# Patient Record
Sex: Female | Born: 1980 | Hispanic: No | Marital: Single | Smoking: Former smoker
Health system: Southern US, Community
[De-identification: ages and names within clinical notes are randomized; demographics above are authoritative.]

## PROBLEM LIST (undated history)

## (undated) DIAGNOSIS — K5000 Crohn's disease of small intestine without complications: Principal | ICD-10-CM

## (undated) DIAGNOSIS — K5090 Crohn's disease, unspecified, without complications: Secondary | ICD-10-CM

## (undated) DIAGNOSIS — F32A Depression, unspecified: Secondary | ICD-10-CM

## (undated) DIAGNOSIS — K5190 Ulcerative colitis, unspecified, without complications: Secondary | ICD-10-CM

## (undated) DIAGNOSIS — F429 Obsessive-compulsive disorder, unspecified: Secondary | ICD-10-CM

## (undated) DIAGNOSIS — I10 Essential (primary) hypertension: Secondary | ICD-10-CM

## (undated) DIAGNOSIS — Q639 Congenital malformation of kidney, unspecified: Secondary | ICD-10-CM

## (undated) DIAGNOSIS — F419 Anxiety disorder, unspecified: Secondary | ICD-10-CM

## (undated) HISTORY — PX: COLON SURGERY: SHX602

## (undated) HISTORY — PX: FINGER SURGERY: SHX640

## (undated) HISTORY — PX: HIP SURGERY: SHX245

---
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
## 2003-12-11 ENCOUNTER — Inpatient Hospital Stay (HOSPITAL_COMMUNITY): Admission: AD | Admit: 2003-12-11 | Discharge: 2003-12-11 | Payer: Self-pay | Admitting: Obstetrics and Gynecology

## 2004-04-15 ENCOUNTER — Inpatient Hospital Stay (HOSPITAL_COMMUNITY): Admission: AD | Admit: 2004-04-15 | Discharge: 2004-04-15 | Payer: Self-pay | Admitting: Obstetrics & Gynecology

## 2004-04-22 ENCOUNTER — Ambulatory Visit: Payer: Self-pay | Admitting: Family Medicine

## 2004-07-13 ENCOUNTER — Ambulatory Visit: Payer: Self-pay | Admitting: Obstetrics and Gynecology

## 2004-08-26 ENCOUNTER — Ambulatory Visit: Payer: Self-pay | Admitting: Obstetrics and Gynecology

## 2005-07-07 ENCOUNTER — Inpatient Hospital Stay (HOSPITAL_COMMUNITY): Admission: EM | Admit: 2005-07-07 | Discharge: 2005-07-17 | Payer: Self-pay | Admitting: Emergency Medicine

## 2005-10-20 ENCOUNTER — Emergency Department (HOSPITAL_COMMUNITY): Admission: EM | Admit: 2005-10-20 | Discharge: 2005-10-20 | Payer: Self-pay | Admitting: Emergency Medicine

## 2005-11-29 ENCOUNTER — Inpatient Hospital Stay (HOSPITAL_COMMUNITY): Admission: EM | Admit: 2005-11-29 | Discharge: 2005-12-02 | Payer: Self-pay | Admitting: Emergency Medicine

## 2006-05-01 ENCOUNTER — Emergency Department (HOSPITAL_COMMUNITY): Admission: EM | Admit: 2006-05-01 | Discharge: 2006-05-01 | Payer: Self-pay | Admitting: Emergency Medicine

## 2008-02-18 ENCOUNTER — Inpatient Hospital Stay
Admission: EM | Admit: 2008-02-18 | Discharge: 2008-02-19 | Disposition: A | Discharge status: Other Acute Inpatient Hospital | Source: Emergency Department | Attending: Nurse Practitioner | Admitting: Nurse Practitioner

## 2008-02-18 DIAGNOSIS — K630 Abscess of intestine: Secondary | ICD-10-CM

## 2008-02-18 LAB — CBC WITH AUTOMATED DIFF
ABS. LYMPHOCYTES: 3 10*3/uL (ref 0.6–4.3)
ABS. MONOCYTES: 1 10*3/uL — ABNORMAL HIGH (ref 0.1–0.9)
ABS. NEUTROPHILS: 15.1 10*3/uL — ABNORMAL HIGH (ref 1.9–7.8)
HCT: 39.1 % (ref 35.6–45.0)
HGB: 13.3 g/dL (ref 11.7–15.0)
LYMPHOCYTES: 16 % (ref 14.7–41.3)
MCH: 28.9 PG (ref 26.1–32.9)
MCHC: 34 g/dL (ref 31.4–35.0)
MCV: 84.8 FL (ref 79.6–97.8)
MONOCYTES: 5 % (ref 3.2–9.0)
MPV: 7.9 FL (ref 7.4–10.4)
NEUTROPHILS: 79 % — ABNORMAL HIGH (ref 47.0–74.6)
PLATELET: 411 10*3/uL (ref 140–440)
RBC: 4.61 M/uL (ref 3.86–5.18)
RDW: 12.5 % (ref 11.9–14.6)
WBC: 19.1 10*3/uL — ABNORMAL HIGH (ref 4.5–10.5)

## 2008-02-18 LAB — METABOLIC PANEL, COMPREHENSIVE
A-G Ratio: 0.7 — ABNORMAL LOW (ref 1.2–3.5)
ALT (SGPT): 28 U/L — ABNORMAL LOW (ref 39–65)
AST (SGOT): 8 U/L — ABNORMAL LOW (ref 15–37)
Albumin: 3.1 g/dL — ABNORMAL LOW (ref 3.5–5.0)
Alk. phosphatase: 149 U/L — ABNORMAL HIGH (ref 50–136)
Anion gap: 11 mmol/L (ref 7–16)
BUN: 10 MG/DL (ref 7–18)
Bilirubin, total: 0.3 MG/DL (ref 0.2–1.1)
CO2: 24 MMOL/L (ref 21–32)
Calcium: 8.7 MG/DL (ref 8.4–10.4)
Chloride: 104 MMOL/L (ref 98–107)
Creatinine: 0.8 MG/DL (ref 0.6–1.0)
GFR est AA: >60 mL/min/{1.73_m2} (ref >60)
GFR est non-AA: >60 mL/min/{1.73_m2} (ref >60)
Globulin: 4.4 g/dL — ABNORMAL HIGH (ref 2.3–3.5)
Glucose: 104 MG/DL (ref 74–106)
Potassium: 3.6 MMOL/L (ref 3.5–5.1)
Protein, total: 7.5 g/dL (ref 6.3–8.2)
Sodium: 139 MMOL/L (ref 136–145)

## 2008-02-18 LAB — HCG URINE, QL. - POC: Pregnancy test,urine (POC): NEGATIVE

## 2008-02-18 MED ADMIN — hydrocortisone sodium succinate (SOLUCORTEF) injection 100 mg: INTRAVENOUS | @ 23:00:00 | NDC 00409485605

## 2008-02-18 MED ADMIN — mesalamine EC (ASACOL) tablet 400 mg: ORAL | @ 14:00:00 | NDC 00149075215

## 2008-02-18 MED ADMIN — levofloxacin (LEVAQUIN) 750 mg infusion: INTRAVENOUS | @ 13:00:00 | NDC 00045006601

## 2008-02-18 MED ADMIN — ioversol (OPTIRAY) 350 mg/mL contrast solution 100 mL: INTRAVENOUS | @ 08:00:00 | NDC 00019133321

## 2008-02-18 MED ADMIN — nicotine (NICODERM CQ) 21 mg/24 hr patch 1 Patch: TRANSDERMAL | @ 12:00:00 | NDC 00067512609

## 2008-02-18 MED ADMIN — HYDROmorphone (DILAUDID) injection 0.5 mg: INTRAVENOUS | @ 14:00:00 | NDC 00641012121

## 2008-02-18 MED ADMIN — lorazepam (ATIVAN) tablet 0.5 mg: ORAL | @ 19:00:00 | NDC 00904150061

## 2008-02-18 MED ADMIN — mesalamine EC (ASACOL) 400 mg tablet: @ 10:00:00 | NDC 00149075215

## 2008-02-18 MED ADMIN — dextrose 5% - 0.45% NaCl with KCl 20 mEq/L infusion: INTRAVENOUS | @ 22:00:00 | NDC 00409790209

## 2008-02-18 MED ADMIN — sodium chloride 0.9 % bolus infusion 1,000 mL: INTRAVENOUS | @ 05:00:00 | NDC 00409798309

## 2008-02-18 MED ADMIN — lorazepam (ATIVAN) injection 0.5 mg: INTRAVENOUS | @ 12:00:00 | NDC 10019010239

## 2008-02-18 MED ADMIN — promethazine (PHENERGAN) injection 25 mg: INTRAVENOUS | @ 06:00:00 | NDC 00641092821

## 2008-02-18 MED ADMIN — metronidazole (FLAGYL) IVPB 500 mg: INTRAVENOUS | @ 14:00:00 | NDC 00409781137

## 2008-02-18 MED ADMIN — mesalamine EC (ASACOL) tablet 800 mg: ORAL | @ 22:00:00 | NDC 00149075215

## 2008-02-18 MED ADMIN — HYDROmorphone (DILAUDID) injection 0.5 mg: INTRAVENOUS | @ 18:00:00 | NDC 00641012121

## 2008-02-18 MED ADMIN — promethazine (PHENERGAN) injection 12.5 mg: INTRAVENOUS | @ 14:00:00 | NDC 00641092821

## 2008-02-18 MED ADMIN — diatrizoate meglumine & sodium (MD-GASTROVIEW) 66-10 % contrast solution 30 mL: ORAL | @ 08:00:00 | NDC 00019481605

## 2008-02-18 MED ADMIN — mesalamine EC (ASACOL) tablet 400 mg: ORAL | @ 21:00:00 | NDC 68258912901

## 2008-02-18 MED ADMIN — HYDROmorphone (DILAUDID) injection 0.5 mg: INTRAVENOUS | @ 23:00:00 | NDC 00641012121

## 2008-02-18 MED ADMIN — HYDROmorphone (DILAUDID) injection 1 mg: INTRAVENOUS | @ 08:00:00 | NDC 00641012121

## 2008-02-18 MED ADMIN — metronidazole (FLAGYL) IVPB 500 mg: INTRAVENOUS | @ 09:00:00 | NDC 00409781137

## 2008-02-18 MED ADMIN — neomycin-bacitracin-polymyxin (NEOSPORIN) ointment: TOPICAL | @ 19:00:00 | NDC 45802006103

## 2008-02-18 MED ADMIN — neomycin-bacitracin-polymyxin (NEOSPORIN) ointment: TOPICAL | @ 23:00:00 | NDC 96295006880

## 2008-02-18 MED ADMIN — metronidazole (FLAGYL) IVPB 500 mg: INTRAVENOUS | @ 22:00:00 | NDC 00409781137

## 2008-02-18 MED ADMIN — dextrose 5% - 0.45% NaCl with KCl 20 mEq/L infusion: INTRAVENOUS | @ 12:00:00 | NDC 00409790209

## 2008-02-18 MED ADMIN — morphine injection 5 mg: INTRAVENOUS | @ 06:00:00 | NDC 10019017839

## 2008-02-18 MED ADMIN — metronidazole (FLAGYL) IVPB 500 mg: INTRAVENOUS | @ 12:00:00 | NDC 80830246509

## 2008-02-18 MED ADMIN — ketorolac (TORADOL) injection 30 mg: INTRAVENOUS | @ 06:00:00 | NDC 00409379501

## 2008-02-18 NOTE — Progress Notes (Signed)
Pt off of floor to go smoke

## 2008-02-18 NOTE — ED Notes (Signed)
In/out cath for 30 ml clear yellow urine. Was very painful per pt. States abd pain worse with movement.

## 2008-02-18 NOTE — Progress Notes (Signed)
New iv site started to right inner forearm on first attempt 20 jelco. Pt tolerated well

## 2008-02-18 NOTE — Progress Notes (Signed)
Pt resting quietly without complaints of pain, respirations are even no distress observed.

## 2008-02-18 NOTE — Progress Notes (Signed)
Patient complains of pain rated 6/10 on scale. Dilaudid 0.5 mg slow IV given for complaint of pain. Patient complains of nausea, no emesis or vomiting noted. Phenergan 12.5 mg slow IV given for complaint of nausea.

## 2008-02-18 NOTE — ED Notes (Signed)
finished oral contrast

## 2008-02-18 NOTE — Progress Notes (Signed)
TRANSFER - IN REPORT:    Verbal report received from Caren Libertoon [employer]  being received from [facility] [employer] routine progression of care      Report consisted of patient???s Situation, Background, Assessment and   Recommendations(SBAR).     Information from the following report(s) ED Summary was reviewed with the receiving nurse.    Opportunity for questions and clarification was provided.      Assessment completed upon patient???s arrival to unit and care assumed.

## 2008-02-18 NOTE — ED Notes (Signed)
2nd bag NS up at 200cc/hr

## 2008-02-18 NOTE — ED Notes (Signed)
To CT

## 2008-02-18 NOTE — ED Notes (Signed)
Pt reported to Dr. Sheffield Slider as a very probable appendicitis and is just waiting on contrasted CT to confirm.

## 2008-02-18 NOTE — Progress Notes (Signed)
Pt rates pain 6/10. Some relief noted from Dilaudid. Pt given broth and jello per request and notified of NPO status after midnight. Verbalizes understanding.

## 2008-02-18 NOTE — ED Notes (Signed)
Dr. Selena Batten here to see

## 2008-02-18 NOTE — Progress Notes (Signed)
TRANSFER - OUT REPORT:    Verbal report given to Jasmine December, RN on [employer]  being transferred to Room 346 for routine progression of care       Report consisted of patient???s Situation, Background, Assessment and   Recommendations(SBAR).     Information from the following report(s) ED Summary was reveiwed with the receiving nurse.    Opportunity for questions and clarification was provided.

## 2008-02-18 NOTE — Progress Notes (Signed)
Bedside report given to [facility] questions asked and answered

## 2008-02-18 NOTE — Progress Notes (Signed)
Problem: Nutrition Deficit  Goal: *Optimize nutritional status  Nutrition screen based on risk factors identified on admission data base:  poor po, n/v/d and allergy to seafood.  Problem:  Altered GI function r/t Crohn's as evidenced by n/v/d, abdominal pain; currently NPO status.  Weight loss of 10-15# past couple of months r/t early satiety, nausea and diarrhea. Currently at 100% IBW with BMI 21.3.  Decreased albumin suggestive of mild visceral protein depletion.  Goal:  Tolerance of po intake to maintain/ promote weight gain and replete protein stores.  Estimated needs:  1750-2025 cal/day (30-35 kcal/kg/abw), 58-70 gm pro/day (1-1.2 gm pro/kg/abw), fluids of 1 ml/kcal or per MD recommendation.  Intervention:  Continue NPO status.  Noted allergy to seafood in Foods and Nutrition.    Initiate Ensure Plus supplement once diet order permits.  Follow up for diet advancement/tolerance.

## 2008-02-18 NOTE — Progress Notes (Signed)
Pt resting quietly SF, respirations even and unlabored. C/O pain at IV site. Warm pack applied, good blood return noted. Pt requesting new IV. Norvel Richards notified d/t primary nurse still receiving report. No distress noted. Will continue to monitor.

## 2008-02-18 NOTE — ED Notes (Signed)
Patient refuses steriods because when dx with crohns in [place] she became mean and gained 100+ lbs.

## 2008-02-18 NOTE — ED Provider Notes (Signed)
Patient is a 28 y.o. female presenting with abdominal pain. The history is provided by the patient.   Abdominal Pain   The current episode started more than 2 days ago. The problem occurs constantly. The problem has been rapidly worsening. Associated symptoms include back pain. Pertinent negatives include no fever. The patient's surgical history includes cholecystectomy.       Past Medical History   Diagnosis Date   ??? Asthma    ??? Gastrointestinal Disorder      chrones   ??? Other Ill-Defined Conditions      herpes   ??? Chronic Kidney Disease      no right kidney          Past Surgical History   Procedure Date   ??? Abdomen surgery proc unlisted      uterus  blockage removed   ??? Hx orthopaedic      r pinkey   ??? Hx cholecystectomy    ??? Hx other surgical      neg lap this summer           No family history on file.     History   Social History   ??? Marital Status: Married     Spouse Name: N/A     Number of Children: N/A   ??? Years of Education: N/A   Occupational History   ??? Not on file.   Social History Main Topics   ??? Tobacco Use: Yes -- 1.0 packs/day   ??? Alcohol Use: No   ??? Drug Use: No   ??? Sexually Active: Yes   Other Topics Concern   ??? Not on file   Social History Narrative   ??? No narrative on file           ALLERGIES: Penicillin g      Review of Systems   Constitutional: Negative for fever.   Gastrointestinal: Positive for abdominal pain.   Genitourinary: Positive for vaginal bleeding and pelvic pain.   Musculoskeletal: Positive for back pain.   All other systems reviewed and are negative.        Filed Vitals:    02/17/2008 10:52 PM   BP: 95/67   Pulse: 110   Temp: 99.7 ??F (37.6 ??C)   Resp: 20   Height: 5\' 5"  (1.651 m)   Weight: 128 lb (58.06 kg)              Physical Exam   Constitutional: She is oriented. She appears well-developed and well-nourished. No distress.   HENT:   Head: Normocephalic and atraumatic.   Eyes: Conjunctivae are normal. Pupils are equal, round, and reactive to light.    Cardiovascular: Normal rate and regular rhythm.    Pulmonary/Chest: Effort normal. No respiratory distress.   Abdominal: Soft. She exhibits no distension and no mass. Tenderness (acute) is present in the right lower quadrant. She has guarding and pain at McBurney's point. She has no rebound and no CVA tenderness.        Positive psoas and rovsings signs     Musculoskeletal: Normal range of motion. She exhibits no edema and no tenderness.   Neurological: She is alert and oriented.   Skin: Skin is warm and dry. No rash noted. She is not diaphoretic.   Psychiatric: She has a normal mood and affect. Her behavior is normal. Judgment and thought content normal.            Coding      Procedures

## 2008-02-18 NOTE — Progress Notes (Signed)
MD Dareen Piano consulted for possible appendicitis and abscess on ileum.

## 2008-02-18 NOTE — ED Notes (Signed)
Dr. Sheffield Slider in to talk with pt

## 2008-02-18 NOTE — Progress Notes (Signed)
Pt brought back to floor by security in wheelchair, pt dizzy from smoking. Pt pale

## 2008-02-18 NOTE — Progress Notes (Signed)
Dilaudid 0.5 mg slow IV given for complaint of pain rated 7/10 on scale after NP Teena Dunk completed rectal exam.

## 2008-02-18 NOTE — Progress Notes (Signed)
Provided a caring presence and support during interdisciplinary rounds.    Signed- Zelma Snead Bailey-Loui Massenburg, M.Div. , Staff Chaplain

## 2008-02-18 NOTE — ED Notes (Signed)
Assisted to BR. States pain sstill 7/10. MD notified.

## 2008-02-18 NOTE — Progress Notes (Signed)
Pt resting quietly without complaints of pain, respirations are even no distress observed. Report given to  RN no distress noted

## 2008-02-18 NOTE — ED Notes (Signed)
Back from CT.

## 2008-02-18 NOTE — Progress Notes (Signed)
Problem: Interdisciplinary Rounds  Goal: Interdisciplinary Rounds  Interdisciplinary team rounds were held 02/18/2008 with the following team members:Care Management, Nursing, Nutrition and Pastoral Care and the patient.  Plan of Care options were discussed with the team and the patient.  The patient would benefit from a screening and/or visit from Nursing.

## 2008-02-18 NOTE — ED Notes (Signed)
Starting oral contrast

## 2008-02-18 NOTE — Progress Notes (Signed)
[facility] called to inform of MD Rutherford Limerick as being the on-call doctor.

## 2008-02-18 NOTE — Progress Notes (Signed)
Pt arrived to floor via stretcher, has to go to bathroom, upset that she has to answer questions that was asked in ER

## 2008-02-18 NOTE — H&P (Addendum)
GEN GENERIC H&P/CONSULT    Subjective:     Patient is a 28 y.o. Caucasian female presents with acute right lower quadrant abdominal pain radiating to back and throughout abdomen.  States that pain began two days ago and has progressively worsened.  Denies associated fever or chills.  Has had intermittent nausea, but no emesis, and appetite has been limited.  Reports menstrual period which began on Monday.  No other vaginal bleeding or drainage.  Past history includes inflammatory bowel disease, felt to be Crohn's for which she is on no meds.  Notes no change in bowel habits, but has baseline diarrhea typically about 6-7 stools per day.  Denies blood or mucous in stools.    In ER, WBC's elevated to 19,100 with 79% neutrophils and 1% monos.  Other hematology and chemistries WNL, except elevated Alkaline phosphatase of 149.  CT scan of abdomen/pelvis revealed marked thickening of the wall of the terminal ileum with abscess, and secondary inflammation of the appendix. Ms. Trettin will be admitted for management of her abscess.  IV Flagyl has been initiated in the ER, and Levaquin will be added.    PCP: Dr. Briscoe Deutscher    Past Medical History   Diagnosis Date   ??? Asthma    ??? Chronic Kidney Disease      no right kidney   ??? Other Ill-Defined Conditions      herpes   ??? Gastrointestinal Disorder      crohn's        Past Surgical History   Procedure Date   ??? Abdomen surgery proc unlisted      uterus  blockage removed   ??? Hx orthopaedic      r pinkey   ??? Hx cholecystectomy    ??? Hx other surgical      neg lap this summer        Prior to Admission medications    Medication Sig Start Date End Date Taking? Authorizing Provider   lorazepam (ATIVAN) 0.5 mg tablet Take 0.5 mg by mouth.   Yes Phys Other, MD       Allergies   Allergen Reactions   ??? Penicillin G Hives        History   Substance Use Topics   ??? Tobacco Use: Yes -- 1.0 packs/day   ??? Alcohol Use: No        Family History   Problem Relation   ??? Diabetes Mother             Review of Systems    A comprehensive review of systems was negative except for that written in the HPI.    Objective:               BP 116/82   Pulse 84   Temp 98.7 ??F (37.1 ??C)   Resp 20   Ht 5\' 5"  (1.651 m)   Wt 128 lb (58.06 kg)   LMP 02/17/2008  General:  Alert, tearful, cooperative, appears stated age.   Head:  Normocephalic, without obvious abnormality, atraumatic.   Eyes:  Conjunctivae/corneas clear. PERRL, EOMs intact.    Throat: Lips, mucosa, and tongue normal. Teeth and gums normal.   Neck: Supple, symmetrical, trachea midline, no adenopathy, thyroid: no enlargment/tenderness/nodules, no carotid bruit and no JVD.   Back:   Symmetric, no curvature. ROM normal.    Lungs:   Clear to auscultation bilaterally.   Heart:  Regular rate and rhythm, S1, S2 normal, no murmur, click, rub or gallop.  Abdomen:   Soft, nondistended.  Tenderness to palpation with guarding in RLQ with generalized tenderness throughout abdomen.  Bowel sounds normal. No masses,  No organomegaly.   Extremities: Extremities normal, atraumatic, no cyanosis or edema.   Pulses: 2+ and symmetric all extremities.   Skin: Skin color, texture, turgor normal. No rashes or lesions.   Neurologic: CNII-XII intact. Normal strength and sensation throughout.         Data Review:     Recent Results (from the past 24 hour(s))   CBC W/ AUTOMATED DIFF    Collection Time 02/17/08 11:58 PM   Component Value Range   ??? WBC 19.1 (*) 4.5 - 10.5 (K/uL)   ??? RBC 4.61  3.86 - 5.18 (M/uL)   ??? HGB 13.3  11.7 - 15.0 (g/dL)   ??? HCT 39.1  35.6 - 45.0 (%)   ??? MCV 84.8  79.6 - 97.8 (FL)   ??? MCH 28.9  26.1 - 32.9 (PG)   ??? MCHC 34.0  31.4 - 35.0 (g/dL)   ??? RDW 12.5  11.9 - 14.6 (%)   ??? PLATELET 411  140 - 440 (K/uL)   ??? MPV 7.9  7.4 - 10.4 (FL)   ??? NEUTROPHILS 79 (*) 47.0 - 74.6 (%)   ??? LYMPHOCYTES 16  14.7 - 41.3 (%)   ??? MONOCYTES 5  3.2 - 9.0 (%)   ??? ABSOLUTE NEUTS 15.1 (*) 1.9 - 7.8 (K/UL)   ??? ABSOLUTE LYMPHS 3.0  0.6 - 4.3 (K/UL)   ??? ABSOLUTE MONOS 1.0 (*) 0.1 - 0.9 (K/UL)    ??? DF AUTOMATED     METABOLIC PANEL, COMPREHENSIVE    Collection Time 02/17/08 11:58 PM   Component Value Range   ??? Sodium 139  136 - 145 (MMOL/L)   ??? Potassium 3.6  3.5 - 5.1 (MMOL/L)   ??? Chloride 104  98 - 107 (MMOL/L)   ??? CO2 24  21 - 32 (MMOL/L)   ??? Anion gap 11  7 - 16 (mmol/L)   ??? Glucose 104  74 - 106 (MG/DL)   ??? BUN 10  7 - 18 (MG/DL)   ??? Creatinine 0.8  0.6 - 1.0 (MG/DL)   ??? GFR est AA >60  >60 (ml/min/1.10m2)   ??? GFR est non-AA >60  >60 (ml/min/1.47m2)   ??? Calcium 8.7  8.4 - 10.4 (MG/DL)   ??? Bilirubin, total 0.3  0.2 - 1.1 (MG/DL)   ??? ALT 28 (*) 39 - 65 (U/L)   ??? AST 8 (*) 15 - 37 (U/L)   ??? Alk. phosphatase 149 (*) 50 - 136 (U/L)   ??? Protein, total 7.5  6.3 - 8.2 (g/dL)   ??? Albumin 3.1 (*) 3.5 - 5.0 (g/dL)   ??? Globulin 4.4 (*) 2.3 - 3.5 (g/dL)   ??? A-G Ratio 0.7 (*) 1.2 - 3.5 ( )   HCG, UR, QL - POC    Collection Time 02/18/08 12:35 AM   Component Value Range   ??? Pregnancy test,urine (POC) negative   > Negative    ??? Pregnancy test,QC                 Assessment:     Patient Active Hospital Problem List:  *Abscess of Intestine (02/18/2008) - continue Flagyl and add Levaquin.  [facility]    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    Anxiety - use Ativan prn.      Plan:     1.  Admit Inpatient Medical.  2. Continue Flagyl.  3.  Add Levaquin.  4. Maintain NPO with meds, pending GI consult.  5. IV hydration.    6. DVT prophylaxis with SCD's and ambulation for now pending decisions re:  GI work-up.  7. Medicate prn pain/anxiety.  8. Nicoderm patch - reviewed need to quit smoking - not interested at this time.  9.  Check blood cultures.    Elita Boone, NP    Pt seen and examined.  Agree with above.  Pt with no active nausea or vomiting or diarrhea.  She is afebrile.  Waiting for GI to see.    Towanda Malkin, MD

## 2008-02-18 NOTE — ED Notes (Signed)
I have reviewed the chart and agree with the midlevel provider's course of treatment.  Avin Gibbons Louis Tausha Milhoan, MD

## 2008-02-18 NOTE — Progress Notes (Signed)
Patient voiced concerns and disapproval with surgery consult Dr.Farooq and requested to speak with patient relations. I informed my charge nurse R. Freida Busman RN who contacted patient relations.

## 2008-02-18 NOTE — Progress Notes (Signed)
Patient upset after visit from MD Rutherford Limerick, patient crying. Discussed with patient about MD Farooq's visit. Patient states, " Doctor Rutherford Limerick was rude and he really scared me with what he was saying, he even answered his phone and walked out when I was talking to him." patient relations called around 1700 after talking with patient. Patient relations to visit with patient. Patient expresses wished to see a different surgeon, verbalized to patient her healthcare rights, patient verbalizes understanding.

## 2008-02-18 NOTE — Consults (Addendum)
[facility] Consult Note    Referring Physician:  Dr Laveda Norman    Consult Date:  02/18/2008    Admit Date:  02/17/2008    Chief Complaint:  Terminal illeitis and abscess.    Subjective:     History of Present Illness:  Patient is a 28 y.o. female who is seen in consultation at the request of Dr. Laveda Norman for illeum abscess, chron's hisory. She c/o of a constant RLQ ABD pain that has increased in intensity to 10/10 and radiates to her back. She has a history of Cholecystectomy. The pain is accompanied by nausea, without vomiting. She complains hard to breath when pain intensifies. She states that she has never had a Colonoscopy and was diagnosed by x-ray with Chron's. She says that she was set up to have a Colonoscopy in [place], but with anesthesia, she was combative and it was not completed. She has been on Prednisone in 2007 for several months and says that she gained at least 20 pounds and it caused joint aches and she refuses to be on them again.  She c/o heartburn. Denies CP.She denies hematochezia, melena. Her last BM was yesterday and she is currently on her menstrual cycle. She normally has 6 loose stools per day with tenesmus.  She states that she is always cold, denies fever. She lost 10 pounds within the last month.    PMH:  Past Medical History   Diagnosis Date   ??? Asthma    ??? Chronic Kidney Disease      no right kidney   ??? Other Ill-Defined Conditions      herpes   ??? Gastrointestinal Disorder      crohn's         PSH:  Past Surgical History   Procedure Date   ??? Abdomen surgery proc unlisted      uterus  blockage removed   ??? Hx orthopaedic      r pinkey   ??? Hx cholecystectomy    ??? Hx other surgical      neg lap this summer         Allergies:  Allergies   Allergen Reactions   ??? Penicillin G Hives         Home Medications:  Prior to Admission medications    Medication Sig Start Date End Date Taking? Authorizing Provider   lorazepam (ATIVAN) 0.5 mg tablet Take 0.5 mg by mouth.   Yes Phys Other, MD          Hospital Medications:  Current facility-administered medications   Medication Dose Route Frequency   ??? diatrizoate meglumine & sodium (MD-GASTROVIEW) 66-10 % contrast solution 30 mL  30 mL Oral RAD ONCE   ??? ketorolac (TORADOL) injection 30 mg  30 mg IntraVENous NOW   ??? morphine injection 5 mg  5 mg IntraVENous NOW   ??? promethazine (PHENERGAN) injection 25 mg  25 mg IntraVENous NOW   ??? HYDROmorphone (DILAUDID) injection 1 mg  1 mg IntraVENous NOW   ??? ioversol (OPTIRAY) 350 mg/mL contrast solution 100 mL  100 mL IntraVENous RAD ONCE   ??? dexamethasone (DECADRON) injection 10 mg  10 mg IntraVENous NOW   ??? metronidazole (FLAGYL) IVPB 500 mg  500 mg IntraVENous NOW   ??? mesalamine EC (ASACOL) tablet 400 mg  400 mg Oral TID   ??? mesalamine EC (ASACOL) 400 mg tablet        ??? lorazepam (ATIVAN) tablet 0.5 mg  0.5 mg Oral Q6H PRN   ???  hydrocodone-acetaminophen (LORTAB) 5-500 mg per tablet 1 Tab  1 Tab Oral Q4H PRN   ??? HYDROmorphone (DILAUDID) injection 0.5 mg  0.5 mg IntraVENous Q4H PRN   ??? promethazine (PHENERGAN) injection 12.5 mg  12.5 mg IntraVENous Q6H PRN   ??? lorazepam (ATIVAN) injection 0.5 mg  0.5 mg IntraVENous Q6H PRN   ??? zolpidem (AMBIEN) tablet 5 mg  5 mg Oral QHS PRN   ??? nicotine (NICODERM CQ) 21 mg/24 hr patch 1 Patch  1 Patch TransDERmal Q24H   ??? levofloxacin (LEVAQUIN) 750 mg infusion   750 mg IntraVENous Q24H   ??? dextrose 5% - 0.45% NaCl with KCl 20 mEq/L infusion    IntraVENous CONTINUOUS   ??? ondansetron (ZOFRAN) injection 4 mg  4 mg IntraVENous Q4H PRN   ??? metronidazole (FLAGYL) IVPB 500 mg  500 mg IntraVENous Q6H   ??? DISCONTD: dextrose 5 % - 0.45% NaCl 1,000 mL with potassium chloride 20 mEq infusion    IntraVENous CONTINUOUS   ??? DISCONTD: ondansetron (ZOFRAN) injection 4 mg  4 mg IntraVENous Q4H PRN   ??? DISCONTD: metronidazole (FLAGYL) IVPB 500 mg  500 mg IntraVENous Q6H   ??? sodium chloride 0.9 % bolus infusion 1,000 mL  1,000 mL IntraVENous CONTINUOUS         Social History:  History    Substance Use Topics   ??? Tobacco Use: Yes -- 1.0 packs/day   ??? Alcohol Use: No       Pt has 3 tattoos that were placed on bilateral hips, ankle in 2001, 2003, and 2005. Pt denies any history of IV drug use, blood transfusions, prophylactic antibiotics prior to dental work, cardiac stents, pacemaker placement, or vaccinations for Hep A or B.    Family History:  Family History   Problem Relation   ??? Diabetes Mother     She denies a family history of colon cancer, colon polyps, gallbladder disease, ulcers.    Review of Systems:  A detailed 10 system ROS is obtained, with pertinent positives as listed above.  All others are negative.    Objective:     Physical Exam:  Vitals:  BP 87/62   Pulse 81   Temp 97 ??F (36.1 ??C)   Resp 19   Ht 5\' 5"  (1.651 m)   Wt 128 lb (58.06 kg)   SpO2 98%   LMP 02/17/2008  Gen:  Pt is alert, cooperative, no acute distress  Skin:  Extremities and face reveal no rashes. No palmer erythema. No telangiectasias on the chest wall. No juandice noted.   HEENT: Sclerae anicteric.  Extra-occular muscles are intact.  No oral ulcers.  No abnormal pigmentation of the lips.  The neck is supple.  Cardiovascular: Regular rate and rhythm. No murmurs, gallops, or rubs.  Respiratory:  Comfortable breathing with no accessory muscle use. Clear breath sounds anteriorly with no wheezes, rales, or rhonchi.  GI:  Abdomen nondistended, soft, and RLQ tenderness on palpation.  Normal active bowel sounds. No enlargement of the liver or spleen. No masses palpable. No rebound tenderness noted.   Rectal:  Rectal Exam initiated extreme pain did not note any fissures, palpable masses or internal hemorhoids at the level of the exam.  Musculoskeletal:  No pitting edema of the lower legs.  Extremities have good range of motion.  No costovertebral tenderness.  Neurological:  Gross memory appears intact.  Patient is alert and oriented.  Psychiatric:  Mood appears appropriate with judgement intact.   Lymphatic:  No cervical or  supraclavicular adenopathy.    Laboratory:    Recent Labs   Basename 02/17/08 2358   ??? WBC 19.1*   ??? HGB 13.3   ??? HCT 39.1   ??? PLT 411   ??? MCV 84.8   ??? NA 139   ??? K 3.6   ??? CL 104   ??? CO2 24   ??? BUN 10   ??? CREA 0.8   ??? CA 8.7   ??? GLU 104   ??? AP 149*   ??? SGOT 8*   ??? GPT 28*   ??? TBIL 0.3   ??? CBIL --   ??? AML --   ??? LPSE --   ??? PTP --   ??? INR --   ??? 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  -Terminal illeum inflammed probably due to Chron's.  -Kidney function within normal limits will continue Asacol. Continue to monitor kidney function while on Asacol.  -Slightly elevated Alk Phos,may be due to fatty liver. CT shows fatty infiltration of liver, will order a Pt/INR/ Lipid Panel for further liver evaluation.   - Although pt is afebrile, Do feel that this is acute appendicitis due to S/Sx: RLQ ABD Pain, increased WBC's, Rectal exam producing extreme pain.   -Continue to monitor WBC. CBC tomorrow a.m.  - Surgery consult Dr Dareen Piano for appendix/illeum abscess for possible drainage and appendix surgery evaluation.  -If Ok with surgery pt may have clear liquid diet today. Will evaluate for advancement of diet of full liquid  Tomorrow, then for a goal of soft, low fat diet.  -Will add Daily Metamucil fiber supplement once pt starts eating po.  -C/o heartburn. Start daily Protonix po today.  -Pt has never had a Colonoscopy. Will hold off on Colonoscopy right now due to the acute exacerbation of inflammatory process. Pt will need a Colonoscopy at some point to further evaluate colon.   -Pt c/o always being cold, will recommend evaluation of thyroid function.  -History of Chron's. CT ABD/Pelvis show   IMPRESSION: ??  1. INFLAMMATORY CHANGES AT THE TERMINAL ILEUM WITH SMALL CONTAINED   PERFORATION AND SMALL ADJACENT RIGHT LOWER QUADRANT ABSCESS. ??FINDINGS   CONSISTENT WITH A TERMINAL ILEITIS AND PROBABLY INFLAMMATORY BOWEL DISEASE.  2. UTERINE DIDELPHIS, CHOLECYSTECTOMY. ??SINGLE RIGHT KIDNEY.          Patient is seen and examined in collaboration with Dr. Jordan Likes.  Assessment and plan as per Dr. Jordan Likes.

## 2008-02-18 NOTE — Progress Notes (Signed)
Pt still rates pain 7/10. No relief noted from lortab and ativan. 0.5mg  Dilaudid given per pt request for pain. Will continue to monitor. Respirations even and unlabored.

## 2008-02-18 NOTE — Consults (Signed)
[facility]   [address]   [place], [place]. [address]   [phone]     CONSULTATION    NAME: Christine, Castillo MR: [id number]  LOC: [id number] [id number] SEX: F ACCT: [id number]  DOB: 09/24/1980 AGE: 28 PT: I  ADMIT: 02/18/2008 DSCH: MSV: MED    DATE OF CONSULTATION: 02/18/2008    REASON FOR CONSULTATION: Abdominal pain, possible terminal ileitis  abscess    BRIEF HISTORY: Christine Castillo is a 28 year old patient with possible  history of Crohn's, history of chronic diarrhea with rectal bleeding,  right lower quadrant abdominal pain and CT evidence of ileitis. At this  time, she presents to the hospital with right lower quadrant abdominal  pain. She was admitted on 02/17/2008. The patient had a CT scan of the  abdomen and pelvis done which showed evidence of terminal ileitis and  questionable abscess with localized perforation and fluid collection  suggestive of that. The patient has been having abdominal pain with  diarrhea. She is having about 8-12 bowel movements. She is quite  miserable. No history of melena. She has some tenesmus. No history  of fever. She has lost about 10 pounds in the last month.    PAST MEDICAL HISTORY:  1. Asthma  2. Chronic kidney disease  3. History of herpes  4. History of Crohn's  5. History of obesity in the past  6. History of depression    PAST SURGICAL HISTORY:  1. The patient has had uterine surgery in the past.  2. Cholecystectomy.  3. Negative laparotomy in the past.    ALLERGIES: PENICILLIN    HOME MEDICATIONS: Lorazepam    HOSPITAL MEDICATIONS: At this time, her hospital medications are  documented and reviewed.    FAMILY HISTORY: No history of cancer.    SOCIAL HISTORY: She smokes a pack of cigarettes. She has twins. No  history of drug abuse.    FAMILY HISTORY: Diabetes. No history of colon cancer.    REVIEW OF SYSTEMS: Negative for 10 systems, other than mentioned  above.    PHYSICAL EXAM:  VITAL SIGNS: Normal, stable, documented in the chart.  HEENT: Normal.   NECK: Soft, non-tender.  LUNGS: Clear.  CARDIAC: S1, S2 audible. No murmur.  ABDOMEN: Soft. Right lower quadrant tenderness with minimal rebound.  No guarding, no signs of rigidity.  RECTAL EXAM; Deferred. It was done by the nurse practitioner and the  patient has no obvious fissures, but did have extreme pain.  MUSCULOSKELETAL, SKIN, NEUROLOGIC EXAMS: Unremarkable.  PSYCHIATRIC: The patient appears to be depressed.    LABORATORY DATA: White count 19,000, hemoglobin 13, hematocrit 39.  CBC is unremarkable. Liver function tests show slight increase in  alkaline phosphatase. CT scan of the abdomen and pelvis has been done.  I have reviewed the results. This showed evidence of terminal ileitis  with localized perforation and small fluid collection suggestive of  possible abscess.    IMPRESSION:  1. Possible Crohn's disease with terminal ileitis localized  perforation.    PLAN: At this time, this 28 year old patient is stable with no signs of  toxicity. My plan at this time is to continue treating her with NPO, IV  fluid hydration, and IV antibiotics. I am going to add IV steroids and  increase her Mesalamine. We will keep a close eye on her and see how she  does. If her symptoms do not improve over the next 24-48 hours, then  patient may be a candidate for exploratory laparotomy. However, if  the  symptoms improve, then the patient may be able to avoid an operation. I  have discussed all of this with the patient. She is not very happy with  her care, but under the circumstances, I have tried to explain to her  that keeping her NPO and starting steroids is the right thing to do. I  will continue to follow her.              Marjory Lies, MD     This is an unverified document unless signed by physician.    TID: [id number] DT: 02/18/2008 4:28 P  JOB: [id number] DOC#: [id number] DD: 02/18/2008    cc: Marjory Lies, MD

## 2008-02-18 NOTE — Progress Notes (Signed)
Pt resting quietly without complaints of pain, respirations are even no distress observed. Report received from offgoing shift RN and care assumed at this time

## 2008-02-18 NOTE — Progress Notes (Signed)
Please see dictated note

## 2008-02-18 NOTE — Progress Notes (Signed)
Lortab and ativan given per pt request for pain 7/10 and anxiety. Husband at [facility]. No other complaints or distress noted. Will continue to monitor.

## 2008-02-18 NOTE — Progress Notes (Signed)
Pt resting quietly without complaints of pain, respirations are even no distress observed. No change in assessment no needs voiced.

## 2008-02-19 ENCOUNTER — Inpatient Hospital Stay
Admit: 2008-02-19 | Discharge: 2008-02-22 | Disposition: A | Discharge status: Home / Self Care | Source: Home / Self Care | Attending: Internal Medicine | Admitting: Internal Medicine

## 2008-02-19 LAB — CBC WITH AUTOMATED DIFF
ABS. BASOPHILS: 0.1 10*3/uL (ref 0.0–0.2)
ABS. EOSINOPHILS: 0 10*3/uL (ref 0.00–0.80)
ABS. LYMPHOCYTES: 1.3 10*3/uL (ref 0.6–4.3)
ABS. MONOCYTES: 0.3 10*3/uL (ref 0.1–0.9)
ABS. NEUTROPHILS: 12.8 10*3/uL — ABNORMAL HIGH (ref 1.9–7.8)
BASOPHILS: 0 % — ABNORMAL LOW (ref 0.1–1.6)
EOSINOPHILS: 0 % — ABNORMAL LOW (ref 0.5–7.8)
HCT: 33.2 % — ABNORMAL LOW (ref 35.6–45.0)
HGB: 11.6 g/dL — ABNORMAL LOW (ref 11.7–15.0)
LYMPHOCYTES: 9 % — ABNORMAL LOW (ref 14.7–41.3)
MCH: 29.5 PG (ref 26.1–32.9)
MCHC: 34.8 g/dL (ref 31.4–35.0)
MCV: 84.7 FL (ref 79.6–97.8)
MONOCYTES: 2 % — ABNORMAL LOW (ref 3.2–9.0)
MPV: 8 FL (ref 7.4–10.4)
NEUTROPHILS: 89 % — ABNORMAL HIGH (ref 47.0–74.6)
PLATELET: 348 10*3/uL (ref 140–440)
RBC: 3.93 M/uL (ref 3.86–5.18)
RDW: 13.2 % (ref 11.9–14.6)
WBC: 14.4 10*3/uL — ABNORMAL HIGH (ref 4.5–10.5)

## 2008-02-19 LAB — METABOLIC PANEL, BASIC
Anion gap: 11 mmol/L (ref 7–16)
BUN: 3 MG/DL — ABNORMAL LOW (ref 7–18)
CO2: 21 MMOL/L (ref 21–32)
Calcium: 8.2 MG/DL — ABNORMAL LOW (ref 8.4–10.4)
Chloride: 108 MMOL/L — ABNORMAL HIGH (ref 98–107)
Creatinine: 0.6 MG/DL (ref 0.6–1.0)
GFR est AA: >60 mL/min/{1.73_m2} (ref >60)
GFR est non-AA: >60 mL/min/{1.73_m2} (ref >60)
Glucose: 140 MG/DL — ABNORMAL HIGH (ref 74–106)
Potassium: 4.4 MMOL/L (ref 3.5–5.1)
Sodium: 140 MMOL/L (ref 136–145)

## 2008-02-19 LAB — MAGNESIUM: Magnesium: 1.8 MG/DL (ref 1.8–2.4)

## 2008-02-19 MED ADMIN — ondansetron (ZOFRAN) injection 4 mg: INTRAVENOUS | NDC 00143989105

## 2008-02-19 MED ADMIN — neomycin-bacitracin-polymyxin (NEOSPORIN) ointment: TOPICAL | @ 14:00:00 | NDC 00713026831

## 2008-02-19 MED ADMIN — dextrose 5% - 0.45% NaCl with KCl 20 mEq/L infusion: INTRAVENOUS | @ 22:00:00 | NDC 00409790209

## 2008-02-19 MED ADMIN — nicotine (NICODERM CQ) 21 mg/24 hr patch 1 Patch: TRANSDERMAL | @ 13:00:00 | NDC 00067512609

## 2008-02-19 MED ADMIN — zolpidem (AMBIEN) tablet 5 mg: ORAL | @ 05:00:00 | NDC 68084022511

## 2008-02-19 MED ADMIN — hydrocortisone sodium succinate (SOLUCORTEF) injection 100 mg: INTRAVENOUS | @ 23:00:00 | NDC 00009090020

## 2008-02-19 MED ADMIN — hydrocortisone sodium succinate (SOLUCORTEF) injection 100 mg: INTRAVENOUS | @ 07:00:00 | NDC 00009082501

## 2008-02-19 MED ADMIN — hydrocodone-acetaminophen (LORTAB) 5-500 mg per tablet 1 Tab: ORAL | @ 02:00:00 | NDC 00406035762

## 2008-02-19 MED ADMIN — dextrose 5% - 0.45% NaCl with KCl 20 mEq/L infusion: INTRAVENOUS | @ 13:00:00 | NDC 00409790209

## 2008-02-19 MED ADMIN — promethazine (PHENERGAN) injection 12.5 mg: INTRAVENOUS | @ 13:00:00 | NDC 00641092821

## 2008-02-19 MED ADMIN — lorazepam (ATIVAN) tablet 0.5 mg: ORAL | @ 16:00:00 | NDC 00904150061

## 2008-02-19 MED ADMIN — HYDROmorphone (DILAUDID) injection 0.5 mg: INTRAVENOUS | @ 20:00:00 | NDC 00641012121

## 2008-02-19 MED ADMIN — HYDROmorphone (DILAUDID) injection 0.5 mg: INTRAVENOUS | @ 03:00:00 | NDC 00641012121

## 2008-02-19 MED ADMIN — mesalamine EC (ASACOL) tablet 800 mg: ORAL | @ 23:00:00 | NDC 00149075215

## 2008-02-19 MED ADMIN — metronidazole (FLAGYL) IVPB 500 mg: INTRAVENOUS | @ 15:00:00 | NDC 00409781137

## 2008-02-19 MED ADMIN — hydrocortisone sodium succinate (SOLUCORTEF) injection 100 mg: INTRAVENOUS | @ 15:00:00 | NDC 00409485605

## 2008-02-19 MED ADMIN — HYDROmorphone (DILAUDID) injection 0.5 mg: INTRAVENOUS | @ 16:00:00 | NDC 00641012121

## 2008-02-19 MED ADMIN — lorazepam (ATIVAN) injection 1 mg: INTRAVENOUS | NDC 10019010239

## 2008-02-19 MED ADMIN — metronidazole (FLAGYL) IVPB 500 mg: INTRAVENOUS | @ 08:00:00 | NDC 00409781137

## 2008-02-19 MED ADMIN — dextrose 5% - 0.45% NaCl with KCl 20 mEq/L infusion: INTRAVENOUS | @ 05:00:00 | NDC 00409790209

## 2008-02-19 MED ADMIN — HYDROmorphone (DILAUDID) injection 0.5 mg: INTRAVENOUS | @ 13:00:00 | NDC 00641012121

## 2008-02-19 MED ADMIN — HYDROmorphone (DILAUDID) injection 1 mg: INTRAVENOUS | @ 22:00:00 | NDC 00641012125

## 2008-02-19 MED ADMIN — promethazine (PHENERGAN) injection 12.5 mg: INTRAVENOUS | @ 05:00:00 | NDC 00641092821

## 2008-02-19 MED ADMIN — metronidazole (FLAGYL) IVPB 500 mg: INTRAVENOUS | @ 02:00:00 | NDC 00409781137

## 2008-02-19 MED ADMIN — levofloxacin (LEVAQUIN) 750 mg infusion: INTRAVENOUS | @ 13:00:00 | NDC 00045006601

## 2008-02-19 MED ADMIN — enoxaparin (LOVENOX) injection 40 mg: SUBCUTANEOUS | @ 23:00:00 | NDC 00075062040

## 2008-02-19 MED ADMIN — mesalamine EC (ASACOL) tablet 800 mg: ORAL | @ 02:00:00 | NDC 00149075215

## 2008-02-19 MED ADMIN — mesalamine EC (ASACOL) tablet 800 mg: ORAL | @ 15:00:00 | NDC 00149075215

## 2008-02-19 MED ADMIN — metronidazole (FLAGYL) IVPB 500 mg: INTRAVENOUS | @ 23:00:00 | NDC 00338105548

## 2008-02-19 MED ADMIN — lorazepam (ATIVAN) tablet 0.5 mg: ORAL | @ 02:00:00 | NDC 00904150061

## 2008-02-19 NOTE — Progress Notes (Signed)
Problem: Nutrition Deficit  Goal: *Optimize nutritional status  Evaluation:  Pt currently NPO;still with RLQ pain. Verbalized food preferences for cl. Liq diet once initiated.  Plan:  Noted preferences for patient in Foods and Nutrition.    Initiate Ensure Plus supplement once diet appropriate.  Follow up for diet advancement / tolerance of po intake.

## 2008-02-19 NOTE — Progress Notes (Signed)
Gastrointestinal Progress Note    02/19/2008    Admit Date: 02/18/2008    Subjective:     Patient is NPO    She says she is a little better since adm, but still has pain in RLQ. Some nausea, no vomiting. No fever.    Bowel Movements: none    Bleeding: none    Current facility-administered medications   Medication Dose Route Frequency   ??? dexamethasone (DECADRON) injection 10 mg  10 mg IntraVENous NOW   ??? lorazepam (ATIVAN) tablet 0.5 mg  0.5 mg Oral Q6H PRN   ??? hydrocodone-acetaminophen (LORTAB) 5-500 mg per tablet 1 Tab  1 Tab Oral Q4H PRN   ??? HYDROmorphone (DILAUDID) injection 0.5 mg  0.5 mg IntraVENous Q4H PRN   ??? promethazine (PHENERGAN) injection 12.5 mg  12.5 mg IntraVENous Q6H PRN   ??? lorazepam (ATIVAN) injection 0.5 mg  0.5 mg IntraVENous Q6H PRN   ??? zolpidem (AMBIEN) tablet 5 mg  5 mg Oral QHS PRN   ??? nicotine (NICODERM CQ) 21 mg/24 hr patch 1 Patch  1 Patch TransDERmal Q24H   ??? levofloxacin (LEVAQUIN) 750 mg infusion   750 mg IntraVENous Q24H   ??? dextrose 5% - 0.45% NaCl with KCl 20 mEq/L infusion    IntraVENous CONTINUOUS   ??? ondansetron (ZOFRAN) injection 4 mg  4 mg IntraVENous Q4H PRN   ??? metronidazole (FLAGYL) IVPB 500 mg  500 mg IntraVENous Q6H   ??? HYDROmorphone (DILAUDID) injection 0.5 mg  0.5 mg IntraVENous ONCE   ??? ondansetron (ZOFRAN) injection 4 mg  4 mg IntraVENous ONCE   ??? neomycin-bacitracin-polymyxin (NEOSPORIN) ointment    Topical BID   ??? hydrocortisone sodium succinate (SOLUCORTEF) injection 100 mg  100 mg IntraVENous Q8H   ??? mesalamine EC (ASACOL) tablet 800 mg  800 mg Oral QID   ??? DISCONTD: mesalamine EC (ASACOL) tablet 400 mg  400 mg Oral TID   ??? DISCONTD: hydrocortisone sodium succinate (SOLU-CORTEF) 100 mg in 0.9% sodium chloride 50 mL IVPB  100 mg IntraVENous Q8H          Objective:     Blood pressure 100/68, pulse 74, temperature 97.2 ??F (36.2 ??C), resp. rate 18, height 5\' 5"  (1.651 m), weight 128 lb (58.06 kg), last menstrual period 02/17/2008, SpO2 96.00%.               EXAM:  Slightly pale. A and O. CV:RRR. Abd: nondistended. Nl BS. Soft. Tender in RLQ. No rebound.    Data Review  Recent Results (from the past 24 hour(s))   CULTURE, BLOOD    Collection Time 02/18/08  9:30 AM   Component Value Range   ??? Specimen Description: BLOOD RT AC     ??? Special Requests: NO SPECIAL REQUESTS     ??? Culture result: NO GROWTH AFTER 19 HOURS     ??? Report Status PENDING     CULTURE, BLOOD    Collection Time 02/18/08  9:40 AM   Component Value Range   ??? Specimen Description: BLOOD     ??? Special Requests: NO SPECIAL REQUESTS     ??? Culture result: NO GROWTH AFTER 19 HOURS     ??? Report Status PENDING     MAGNESIUM    Collection Time 02/19/08  5:09 AM   Component Value Range   ??? Magnesium 1.8  1.8 - 2.4 (MG/DL)   CBC W/ AUTOMATED DIFF    Collection Time 02/19/08  5:09 AM   Component Value Range   ???  WBC 14.4 (*) 4.5 - 10.5 (K/uL)   ??? RBC 3.93  3.86 - 5.18 (M/uL)   ??? HGB 11.6 (*) 11.7 - 15.0 (g/dL)   ??? HCT 33.2 (*) 35.6 - 45.0 (%)   ??? MCV 84.7  79.6 - 97.8 (FL)   ??? MCH 29.5  26.1 - 32.9 (PG)   ??? MCHC 34.8  31.4 - 35.0 (g/dL)   ??? RDW 13.2  11.9 - 14.6 (%)   ??? PLATELET 348  140 - 440 (K/uL)   ??? MPV 8.0  7.4 - 10.4 (FL)   ??? NEUTROPHILS 89 (*) 47.0 - 74.6 (%)   ??? LYMPHOCYTES 9 (*) 14.7 - 41.3 (%)   ??? MONOCYTES 2 (*) 3.2 - 9.0 (%)   ??? EOSINOPHILS 0 (*) 0.5 - 7.8 (%)   ??? BASOPHILS 0 (*) 0.1 - 1.6 (%)   ??? ABSOLUTE NEUTS 12.8 (*) 1.9 - 7.8 (K/UL)   ??? ABSOLUTE LYMPHS 1.3  0.6 - 4.3 (K/UL)   ??? ABSOLUTE MONOS 0.3  0.1 - 0.9 (K/UL)   ??? ABSOLUTE EOSINS 0.0  0.00 - 0.80 (K/UL)   ??? ABSOLUTE BASOS 0.1  0.0 - 0.2 (K/UL)   ??? DF AUTOMATED     METABOLIC PANEL, BASIC    Collection Time 02/19/08  5:09 AM   Component Value Range   ??? Sodium 140  136 - 145 (MMOL/L)   ??? Potassium 4.4  3.5 - 5.1 (MMOL/L)   ??? Chloride 108 (*) 98 - 107 (MMOL/L)   ??? CO2 21  21 - 32 (MMOL/L)   ??? Anion gap 11  7 - 16 (mmol/L)   ??? Glucose 140 (*) 74 - 106 (MG/DL)   ??? BUN 3 (*) 7 - 18 (MG/DL)   ??? Creatinine 0.6  0.6 - 1.0 (MG/DL)    ??? GFR est AA >60  >60 (ml/min/1.31m2)   ??? GFR est non-AA >60  >60 (ml/min/1.77m2)   ??? Calcium 8.2 (*) 8.4 - 10.4 (MG/DL)         Assessment:     Patient Active Hospital Problem List:  *Abscess of Intestine (02/18/2008)    Crohn Disease (02/18/2008)    Asthma, Exercise Induced (02/18/2008)    Herpes Genital (02/18/2008)    Abdominal Pain, Right Lower Quadrant (02/18/2008)    Tobacco Abuse (02/18/2008)    Anxiety State, Unspecified (02/18/2008)    She has complicated Crohn's ileitis with abscess formation. She has had some clinical response. Her leukocytosis has improved as well. She has significant concerns about using immunosuppressive tx and possible flare of genital herpes. (She states that with Pred in the past and even with Pentasa alone, she had flares. She has never been on chronic antiviral tx).    Plan:     If she does not have significant improvement over the next 24 hrs, then would strongly consider surgical resection. She would still require longterm tx for Crohn's. For better compliance, rec Lialda or Apriso, both of which offer once a day tx. In addition, rec longterm anti Herpes viral tx.  For now, agree with current medical regimen.  Caffie Pinto MD

## 2008-02-19 NOTE — Progress Notes (Signed)
Pt resting without any co voiced

## 2008-02-19 NOTE — Progress Notes (Signed)
Bedside report given

## 2008-02-19 NOTE — Discharge Summary (Signed)
[facility]   [address]   [place], [place]. [address]   [phone]     DISCHARGE SUMMARY  ________________________________________________________________________  NAME: Christine Castillo, Christine Castillo MR: [id number]  LOC: W3F [id number] SEX: F ACCT: [id number]  DOB: Oct 13, 1980 AGE: 28 PT: I  ADMIT: 02/18/2008 DSCH: MSV: MED    DATE OF ADMISSION: 02/18/2008  DATE OF TRANSFER: 02/19/2008    ADMITTING DIAGNOSES:   1. Right lower quadrant abdominal pain.   2. Ileitis with abscess.   3. Crohn exacerbation.   4. Anxiety disorder.    TRANSFER DIAGNOSES:   1. Right lower quadrant abdominal pain.   2. Ileitis with abscess.   3. Crohn exacerbation.   4. Anxiety disorder.    CONSULTING PHYSICIANS:  Include:   1. Tommy L. Henreitta Leber, MD.   2. Jerene Bears with [facility].    PROCEDURES: Include CT scan of the abdomen and pelvis demonstrating the  patient with inflammatory changes at the terminal ileum with a small  contained perforation and small adjacent right lower quadrant abscess and  findings consistent with terminal ileitis.    TRANSFER MEDICATIONS:  Include:   1. Levaquin 750 mg IV daily.   2. Flagyl 500 mg IV q.8h.   3. Asacol 800 mg q.i.d.   4. Solu-Cortef 100 mg IV every 8 hours.   5. Lortab 7.5 mg every 4 to 6 hours as-needed.   6. Ativan 1 mg IV every 6 hours as-needed.   7. Phenergan 12.5 mg every 6 hours as-needed.   8. Zofran 4 mg IV every 4 hours as-needed.   9. Morphine 1 or 2 mg IV every 4 hours as-needed.    ACCEPTING PHYSICIAN: Margit Banda, MD    DIET: The patient is n.p.o. except medications and will be placed on  TPN.    ACTIVITIES: As tolerated.    HOSPITAL COURSE: The patient is a 28 year old female with a history of  Crohn disease who presented to the emergency room on 02/18/2008 with  right lower quadrant abdominal pain that radiated to the back and her  symptoms started 2 days prior to her admission and progressively  worsened. The patient had fever, chills, with intermittent nausea   without vomiting. The patient was seen and evaluated in the emergency  room and was reporting diarrhea for at least 7 bouts per day without any  blood or mucus. She was seen in the emergency room where a CT scan of  the abdomen was performed and showed the above results. The patient was  subsequently admitted to the hospital and was placed on IV Flagyl and  Levaquin. She also received IV steroids and [facility] were consulted  and were recommending surgery evaluation. Dr. Fabio Asa was  consulted and was suggesting the patient to have a colonoscopy which will  be performed [facility]. The patient is being transferred downtown for  further care which includes colonoscopy and potential surgery. The  patient will be continued with IV antibiotics and n.p.o. except  medications. The patient will be placed on TPN for nutritional support  and further orders per Hospitalist and specialist services. Dr. Julius Bowels  has been notified of her transfer. Otherwise, she was transferred in  stable condition.          Darrol Jump, MD     This is an unverified document unless signed by physician.    TID: [id number] DT: 02/19/2008 2:44 P  JOB: [id number] DOC#: [id number] DD: 02/19/2008    cc: Lysle Rubens  Rona Ravens, MD

## 2008-02-19 NOTE — Progress Notes (Signed)
Dilaudid 1 mg given slow IVP for c/o pain. Aware to call for assistance Will monitor.

## 2008-02-19 NOTE — Progress Notes (Signed)
Have reviewed chart.  Nothing further to add.      Margit Banda, M.D.

## 2008-02-19 NOTE — Progress Notes (Signed)
ambien 5mg  given po for sleep and phenrgan 12.5mg  ivp diluted with 10cc ns

## 2008-02-19 NOTE — Progress Notes (Addendum)
INTERNAL MEDICINE PROGRESS NOTE    Subjective:   Daily Progress Note: 02/19/2008 11:13 AM    Pt still with RLQ pain.  No diarrhea.    Meds:  Current facility-administered medications   Medication Dose Route Frequency   ??? dexamethasone (DECADRON) injection 10 mg  10 mg IntraVENous NOW   ??? lorazepam (ATIVAN) tablet 0.5 mg  0.5 mg Oral Q6H PRN   ??? hydrocodone-acetaminophen (LORTAB) 5-500 mg per tablet 1 Tab  1 Tab Oral Q4H PRN   ??? HYDROmorphone (DILAUDID) injection 0.5 mg  0.5 mg IntraVENous Q4H PRN   ??? promethazine (PHENERGAN) injection 12.5 mg  12.5 mg IntraVENous Q6H PRN   ??? lorazepam (ATIVAN) injection 0.5 mg  0.5 mg IntraVENous Q6H PRN   ??? zolpidem (AMBIEN) tablet 5 mg  5 mg Oral QHS PRN   ??? nicotine (NICODERM CQ) 21 mg/24 hr patch 1 Patch  1 Patch TransDERmal Q24H   ??? levofloxacin (LEVAQUIN) 750 mg infusion   750 mg IntraVENous Q24H   ??? dextrose 5% - 0.45% NaCl with KCl 20 mEq/L infusion    IntraVENous CONTINUOUS   ??? ondansetron (ZOFRAN) injection 4 mg  4 mg IntraVENous Q4H PRN   ??? metronidazole (FLAGYL) IVPB 500 mg  500 mg IntraVENous Q6H   ??? HYDROmorphone (DILAUDID) injection 0.5 mg  0.5 mg IntraVENous ONCE   ??? ondansetron (ZOFRAN) injection 4 mg  4 mg IntraVENous ONCE   ??? neomycin-bacitracin-polymyxin (NEOSPORIN) ointment    Topical BID   ??? hydrocortisone sodium succinate (SOLUCORTEF) injection 100 mg  100 mg IntraVENous Q8H   ??? mesalamine EC (ASACOL) tablet 800 mg  800 mg Oral QID   ??? DISCONTD: mesalamine EC (ASACOL) tablet 400 mg  400 mg Oral TID   ??? DISCONTD: hydrocortisone sodium succinate (SOLU-CORTEF) 100 mg in 0.9% sodium chloride 50 mL IVPB  100 mg IntraVENous Q8H         Review of Systems:  Pertinent per HPI    Objective:   Vitals:  BP 100/68   Pulse 74   Temp 97.2 ??F (36.2 ??C)   Resp 18   Ht 5\' 5"  (1.651 m)   Wt 128 lb (58.06 kg)   SpO2 96%   LMP 02/17/2008     O2 Device: Room air  Temp (24hrs), Avg:97.7 ??F (36.5 ??C), Min:97.2 ??F (36.2 ??C), Max:98.7 ??F (37.1 ??C)      I/O:          Exam:   General appearance: alert, cooperative, no distress, appears stated age  Lungs: clear to auscultation bilaterally  Heart: regular rate and rhythm, S1, S2 normal, no murmur, click, rub or gallop  Abdomen: soft, RLQ tender. Bowel sounds normal. No masses,  no organomegaly  Extremities: extremities normal, atraumatic, no cyanosis or edema  Neurologic: Grossly normal    Data Review (Labs):  Recent Results (from the past 24 hour(s))   MAGNESIUM    Collection Time 02/19/08  5:09 AM   Component Value Range   ??? Magnesium 1.8  1.8 - 2.4 (MG/DL)   CBC W/ AUTOMATED DIFF    Collection Time 02/19/08  5:09 AM   Component Value Range   ??? WBC 14.4 (*) 4.5 - 10.5 (K/uL)   ??? RBC 3.93  3.86 - 5.18 (M/uL)   ??? HGB 11.6 (*) 11.7 - 15.0 (g/dL)   ??? HCT 33.2 (*) 35.6 - 45.0 (%)   ??? MCV 84.7  79.6 - 97.8 (FL)   ??? MCH 29.5  26.1 - 32.9 (PG)   ???  MCHC 34.8  31.4 - 35.0 (g/dL)   ??? RDW 13.2  11.9 - 14.6 (%)   ??? PLATELET 348  140 - 440 (K/uL)   ??? MPV 8.0  7.4 - 10.4 (FL)   ??? NEUTROPHILS 89 (*) 47.0 - 74.6 (%)   ??? LYMPHOCYTES 9 (*) 14.7 - 41.3 (%)   ??? MONOCYTES 2 (*) 3.2 - 9.0 (%)   ??? EOSINOPHILS 0 (*) 0.5 - 7.8 (%)   ??? BASOPHILS 0 (*) 0.1 - 1.6 (%)   ??? ABSOLUTE NEUTS 12.8 (*) 1.9 - 7.8 (K/UL)   ??? ABSOLUTE LYMPHS 1.3  0.6 - 4.3 (K/UL)   ??? ABSOLUTE MONOS 0.3  0.1 - 0.9 (K/UL)   ??? ABSOLUTE EOSINS 0.0  0.00 - 0.80 (K/UL)   ??? ABSOLUTE BASOS 0.1  0.0 - 0.2 (K/UL)   ??? DF AUTOMATED     METABOLIC PANEL, BASIC    Collection Time 02/19/08  5:09 AM   Component Value Range   ??? Sodium 140  136 - 145 (MMOL/L)   ??? Potassium 4.4  3.5 - 5.1 (MMOL/L)   ??? Chloride 108 (*) 98 - 107 (MMOL/L)   ??? CO2 21  21 - 32 (MMOL/L)   ??? Anion gap 11  7 - 16 (mmol/L)   ??? Glucose 140 (*) 74 - 106 (MG/DL)   ??? BUN 3 (*) 7 - 18 (MG/DL)   ??? Creatinine 0.6  0.6 - 1.0 (MG/DL)   ??? GFR est AA >60  >60 (ml/min/1.73m2)   ??? GFR est non-AA >60  >60 (ml/min/1.38m2)   ??? Calcium 8.2 (*) 8.4 - 10.4 (MG/DL)         Assessment/Plan:   Patient Active Problem List   Diagnoses Date Noted    ??? Abscess of Intestine [569.5]  Cont flagyl and levaquin 02/18/2008   ??? Crohn Disease [555.9B] 02/18/2008   ??? Asthma, Exercise Induced [493.81E] 02/18/2008   ??? 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 Abdominal Pain, Right Lower Quadrant [789.03] 02/18/2008   ??? Tobacco Abuse [305.1U] 02/18/2008   ??? Anxiety State, Unspecified [300.00] 02/18/2008      cont current rx  Transfer downtown per surgical service request  [phone]

## 2008-02-19 NOTE — Progress Notes (Signed)
Pt resting quietly without complaints of pain, respirations are even no distress observed. Report received from offgoing shift RN and care assumed at this time

## 2008-02-19 NOTE — Progress Notes (Signed)
TRANSFER - OUT REPORT:    Verbal report given to A. Mikes RN for on [employer]  being transferred to 2nd floor DT for routine progression of care       Report consisted of patient???s Situation, Background, Assessment and   Recommendations(SBAR).     Information from the following report(s) Kardex, ED Summary, Procedure Summary, Intake/Output, MAR and Recent Results was reviewed with the receiving nurse.    Opportunity for questions and clarification was provided.  Patient had dilaudid .05mg  iv by D. Betchel RN for complaint of pain.

## 2008-02-19 NOTE — Progress Notes (Signed)
Received call from RN at 1417 with reprot that patient was being transferred from [facility] andrequest from Dr Henreitta Leber to start TPN today.TPN order cut-off time is 1330.Hiowever called pharmacy at 1418 to relay MD request and they were unable to make TPN today as all TPN's had been made for the day.Will proceed with nutirtion assessment and plan to start TPN 1-13.  Beadie Matsunaga,RD,LD,CNSD [phone]

## 2008-02-20 LAB — CBC WITH AUTOMATED DIFF
ABS. BASOPHILS: 0 10*3/uL (ref 0.0–0.2)
ABS. EOSINOPHILS: 0 10*3/uL (ref 0.0–0.8)
ABS. IMM. GRANS.: 0.1 10*3/uL (ref 0.0–2.0)
ABS. LYMPHOCYTES: 2.1 10*3/uL (ref 0.5–4.6)
ABS. MONOCYTES: 0.4 10*3/uL (ref 0.1–1.3)
ABS. NEUTROPHILS: 13.1 10*3/uL — ABNORMAL HIGH (ref 1.7–8.2)
BASOPHILS: 0 % (ref 0.0–2.0)
EOSINOPHILS: 0 % — ABNORMAL LOW (ref 0.5–7.8)
HCT: 34.1 % — ABNORMAL LOW (ref 37.6–48.3)
HGB: 11.1 g/dL — ABNORMAL LOW (ref 11.7–15.0)
IMMATURE GRANULOCYTES: 0.5 % (ref 0.0–2.0)
LYMPHOCYTES: 13 % (ref 13–44)
MCH: 27.7 PG (ref 26.1–32.9)
MCHC: 32.6 g/dL (ref 31.4–35.0)
MCV: 85 FL (ref 79.6–97.8)
MONOCYTES: 3 % — ABNORMAL LOW (ref 4.0–12.0)
MPV: 10.8 FL (ref 10.8–14.1)
NEUTROPHILS: 84 % — ABNORMAL HIGH (ref 43–78)
PLATELET: 389 10*3/uL (ref 140–440)
RBC: 4.01 M/uL (ref 3.86–5.18)
RDW: 13.2 % (ref 11.9–14.6)
WBC: 15.7 10*3/uL — ABNORMAL HIGH (ref 4.0–10.5)

## 2008-02-20 LAB — METABOLIC PANEL, BASIC
Anion gap: 10 mmol/L (ref 7–16)
BUN: 4 MG/DL — ABNORMAL LOW (ref 7–18)
CO2: 23 MMOL/L (ref 21–32)
Calcium: 8 MG/DL — ABNORMAL LOW (ref 8.4–10.4)
Chloride: 106 MMOL/L (ref 98–107)
Creatinine: 0.7 MG/DL (ref 0.6–1.0)
GFR est AA: >60 mL/min/{1.73_m2} (ref >60)
GFR est non-AA: >60 mL/min/{1.73_m2} (ref >60)
Glucose: 128 MG/DL — ABNORMAL HIGH (ref 74–106)
Potassium: 3.5 MMOL/L (ref 3.5–5.1)
Sodium: 139 MMOL/L (ref 136–145)

## 2008-02-20 MED ADMIN — HYDROmorphone (DILAUDID) injection 1 mg: INTRAVENOUS | @ 06:00:00 | NDC 00641012125

## 2008-02-20 MED ADMIN — HYDROmorphone (DILAUDID) injection 1 mg: INTRAVENOUS | @ 02:00:00 | NDC 00641012125

## 2008-02-20 MED ADMIN — HYDROmorphone (DILAUDID) injection 1 mg: INTRAVENOUS | @ 14:00:00 | NDC 00641012125

## 2008-02-20 MED ADMIN — mesalamine EC (ASACOL) tablet 800 mg: ORAL | @ 14:00:00 | NDC 00149075215

## 2008-02-20 MED ADMIN — levofloxacin (LEVAQUIN) 750 mg infusion: INTRAVENOUS | @ 14:00:00 | NDC 00045006601

## 2008-02-20 MED ADMIN — metronidazole (FLAGYL) IVPB 500 mg: INTRAVENOUS | @ 22:00:00 | NDC 00338105548

## 2008-02-20 MED ADMIN — fat emulsion (LIPOSYN) 20 % infusion: INTRAVENOUS | @ 22:00:00 | NDC 00338051902

## 2008-02-20 MED ADMIN — lorazepam (ATIVAN) injection 1 mg: INTRAVENOUS | @ 17:00:00 | NDC 10019010239

## 2008-02-20 MED ADMIN — TPN ADULT - CENTRAL: INTRAVENOUS | @ 22:00:00 | NDC 63323017015

## 2008-02-20 MED ADMIN — mesalamine EC (ASACOL) tablet 800 mg: ORAL | @ 17:00:00 | NDC 00149075215

## 2008-02-20 MED ADMIN — metronidazole (FLAGYL) IVPB 500 mg: INTRAVENOUS | @ 15:00:00 | NDC 00338105548

## 2008-02-20 MED ADMIN — enoxaparin (LOVENOX) injection 40 mg: SUBCUTANEOUS | @ 22:00:00 | NDC 00075062040

## 2008-02-20 MED ADMIN — lorazepam (ATIVAN) injection 1 mg: INTRAVENOUS | @ 11:00:00 | NDC 10019010239

## 2008-02-20 MED ADMIN — hydrocodone-acetaminophen (LORTAB) 7.5-500 mg per tablet 1 Tab: ORAL | @ 22:00:00 | NDC 00406035862

## 2008-02-20 MED ADMIN — ondansetron (ZOFRAN) injection 4 mg: INTRAVENOUS | @ 14:00:00 | NDC 00703722101

## 2008-02-20 MED ADMIN — metronidazole (FLAGYL) IVPB 500 mg: INTRAVENOUS | @ 07:00:00 | NDC 00338105548

## 2008-02-20 MED ADMIN — dextrose 5% - 0.45% NaCl with KCl 20 mEq/L infusion: INTRAVENOUS | @ 23:00:00 | NDC 72439050041

## 2008-02-20 MED ADMIN — nicotine (NICODERM CQ) 21 mg/24 hr patch 1 Patch: TRANSDERMAL | @ 21:00:00 | NDC 00067512609

## 2008-02-20 MED ADMIN — hydrocortisone sodium succinate (SOLUCORTEF) injection 50 mg: INTRAVENOUS | @ 15:00:00 | NDC 00009090013

## 2008-02-20 MED ADMIN — hydrocortisone sodium succinate (SOLUCORTEF) injection 50 mg: INTRAVENOUS | @ 22:00:00 | NDC 00009090013

## 2008-02-20 MED ADMIN — mesalamine EC (ASACOL) tablet 800 mg: ORAL | @ 22:00:00 | NDC 00149075215

## 2008-02-20 MED ADMIN — HYDROmorphone (DILAUDID) injection 1 mg: INTRAVENOUS | @ 18:00:00 | NDC 00641012125

## 2008-02-20 MED ADMIN — bisacodyl (DULCOLAX) tablet 20 mg: ORAL | @ 15:00:00 | NDC 00904792761

## 2008-02-20 MED ADMIN — dextrose 5% - 0.45% NaCl with KCl 20 mEq/L infusion: INTRAVENOUS | @ 10:00:00 | NDC 00409790209

## 2008-02-20 MED ADMIN — dextrose 5% - 0.45% NaCl with KCl 20 mEq/L infusion: INTRAVENOUS | @ 16:00:00 | NDC 00409790209

## 2008-02-20 MED ADMIN — hydrocortisone sodium succinate (SOLUCORTEF) injection 100 mg: INTRAVENOUS | @ 07:00:00 | NDC 00009090013

## 2008-02-20 MED ADMIN — mesalamine EC (ASACOL) tablet 800 mg: ORAL | @ 02:00:00 | NDC 00149075215

## 2008-02-20 MED ADMIN — HYDROmorphone (DILAUDID) injection 1 mg: INTRAVENOUS | @ 10:00:00 | NDC 00641012125

## 2008-02-20 MED ADMIN — ondansetron (ZOFRAN) injection 4 mg: INTRAVENOUS | @ 18:00:00 | NDC 00703722101

## 2008-02-20 MED ADMIN — lorazepam (ATIVAN) injection 1 mg: INTRAVENOUS | @ 06:00:00 | NDC 00409677802

## 2008-02-20 MED ADMIN — polyethylene glycol (MIRALAX) powder 255 g: ORAL | @ 16:00:00 | NDC 51991045758

## 2008-02-20 MED ADMIN — lorazepam (ATIVAN) injection 1 mg: INTRAVENOUS | @ 23:00:00 | NDC 10019010239

## 2008-02-20 NOTE — Progress Notes (Signed)
Ativan 1 mg given slow IVP for c/o pain. Aware to call for assistance. Will monitor.

## 2008-02-20 NOTE — H&P (Signed)
[facility]   [address]   [place], [place]. [address]   [phone]     HISTORY AND PHYSICAL    NAME: Christine Castillo, Christine Castillo MR: [id number]  LOC: 02 02011 SEX: F ACCT: [id number]  DOB: [birth year] AGE: 28 PT: I  ADMIT: 02/19/2008 DSCH: MSV: MED    Christine Castillo is a 28 year old white female who I was asked to see at the  [facility]. [facility]. She has a three year history of radiologically  diagnosed Crohn's disease. She has had problems with this off and on.  She has had numerous medical type treatments for this, but she has never  had a surgical resection. The patient has had a negative lap in the  past. She has had a laparoscopic cholecystectomy in the past and also a  GYN procedure. She became worse this past weekend and she presented to  the [facility]. [facility] and she was admitted by the medical service.  Since that time, she has been seen by one of the surgery groups. She  became dissatisfied with their care and I was asked to see her. Her  radiological studies have been reviewed and she has been found to have  possibly a small abscess in the area of the terminal ileum; however, on  review of this with the radiologist, it is somewhat unclear as to exactly  whether or not she has a real abscess or this is simply bowel in the area  that is not well opacified. Also, her white blood cell count on  admission was elevated. It is diminishing as days go by. She has been  started on appropriate antibiotic and steroid treatment for her disease.  It may be noted that she has never had a biopsy proven finding of Crohn's  disease. The diagnosis was simply made on the basis of radiological  studies.    PHYSICAL EXAMINATION: HEENT is within normal limits. The chest is  clear. The heart rate is regular. The abdomen is soft with mild  tenderness in the right lower quadrant, but there is no rebound. Bowel  sounds are active. Rectal examination was not done. This was done   previously and noted as to the findings. The extremities show a full  range of motion.    IMPRESSION: Crohn's disease by previous personal history and  radiological examination, questionable evidence of periileal abscess.    DISPOSITION: I will be glad to assume her care. I believe at this time  she would need to have a colonoscopy to assess the involvement of the  colon with the disease process. If this is clear, I would believe that  continued intravenous antibiotic therapy should be instituted to see if  some of these symptoms would resolve somewhat. In addition, institute  total parenteral nutrition for nutritional purposes and continue  medications as ordered by GI, etc. I have discussed the findings with  the patient and the approach. At this is point, she is fairly adamant  about not wanting to continue with the pain associated with the medical  treatment. She understands that any surgical resection would probably be  followed by a high possibility of recurrence. The patient also  understands that she has one kidney and this would increase the  possibility of hazards during surgery and she understands that surgical  intervention in this case would not be simple, but would be fairly  complex and require an extended amount of time. She wishes to proceed  with surgical  intervention and I informed her that we would do that at an  appropriate time and it could not be done emergently unless we are forced  to do so. So, I am going to transfer her to the [facility]. [facility]  and have GI see her for a colonoscopy and order further laboratory  studies as indicated.              Delphia Grates, MD     This is an unverified document unless signed by physician.    TID: [id number] DT: 02/20/2008 7:57 A  JOB: [id number] DOC#: [id number] DD: 02/20/2008     cc: Delphia Grates, MD

## 2008-02-20 NOTE — Progress Notes (Signed)
Patient in tears. States frustration. States was told that she was to have a colonoscopy today but that nursing says that there are no orders for prep or colonoscopy. States believes the orders have been lost somewhere and that she can't be kept in the hospital for two weeks like the last time she was here. Reviewed chart no orders for colonoscopy and Dr. Jordan Likes makes no mention of colonoscopy in her note. Office closed at this time. Spoke with patient told her that I will do everything in my power to find out what is going on and make sure that Dr. Cyndia Diver orders are correctly being carried out. Discussed with patient that office is closed at this point and MD on call will not know if procedure is scheduled. Verbalized understanding.  1610 Dr. Jordan Likes here states that she did not plan on colonoscopy today had planned on doing prep today and colonoscopy tomorrow. She spoke with patient at length. New orders received

## 2008-02-20 NOTE — Progress Notes (Signed)
Abdomen is soft and no high fevers. For bowel prep today and colo hopefully tomorrow.. I now have found that she has not been compliant in taking her meds and so she has had no possibility of having a good response. Because of this I will defer surgery until she has a try at the medical regimen and some tpn. Her exam is unremarkable today. Wbc is relatively stable.

## 2008-02-20 NOTE — Progress Notes (Signed)
Spiritual  Care IDR with pt    Cindy Bishop, M.Div.  Chaplain

## 2008-02-20 NOTE — Progress Notes (Signed)
Patient requests dilaudid. Discussed with patient that it is not yet time. States "but I can have it every 4 hours" Discussed change from 4-6hrs PRN. Began crying immediately. States "do they want me to lay here dying of pain, why did they change it?" Told patient that NP has no mention in her note why pain meds were decreased. Requests NP be called. [name] NP and Dr. Laveda Norman at desk spoke with them about patient's issues.

## 2008-02-20 NOTE — Progress Notes (Signed)
Problem: Nutrition Deficit  Goal: *Optimize nutritional status  MD consult for TPN management.See RD notes from [facility] admission.  Problem:   ?? Inadequate calorie and protein intake r/t altered GI function/Crohn's ileitis as evidenced by bowel prep today/colonoscopy tomorrow,NPO/Clear Liquid diet since admission (1/11) and low albumin(3.1) potentially c/w mild visceral protein depletion vs non-nutritional cause expect this has decreased since(1/10) with hydration and low BUN 4(1/13) would support decline in protein status.PICC placed for start of TPN today.Consumed ~75% of clear liquids (1/12) per RN notes.   ?? Glucose slightly elevated x2days(128,140mg /dL)-no hx of DM.Currently on D5 ?? NS with KCl(5mEq) @100mL /hr- providing 408kcals.Increased level possibly d/t stress and/or steroids.Will need to start with lower CHO TPN.   ?? Current BW:128#(1/10)-stated weight, BMI: 21 (acceptable),102% of IBW.Wt loss as per prior RD notes. Expect pt has had some wt gain with hydration and IVF.   ?? K 3.5-low normal.Pt at risk for refeeding with start of TPN.Will have increased losses with bowel prep today but IVF does provide K/day.   Goal:   ?? Adequate intake via TPN to prevent nutritional decline until pt can tolerate po intake.   ?? Estimated calorie needs ??? 1750-2025 kcal/day (30-35 kcal/kg/day) Estimated protein needs ??? 70-85 gm pro/day (1.2-1.5 gm pro/kg/IBW/day), Max CHO/day ??? 327 gm CHO/day (4 mg/kg/min);Fluid/day ??? 1 ml/cal/day or per MD.   Intervention:   1. Start TPN @80mL /hr with DEX10%/4.5%AA with of 20% lipids providing 1497 kcals,192g CHO, 86gm protein and fluid/day.    2. When TPN starts, will decrease IVF.   3. Goal TPN of DEX17%/4.5%AA at 80 ml/hr with 250 ml 20% lipids daily to provide 1954kcal,326grams CHO,86 grams Protein per day.   4. Add and F/U TPN labs,wts,GI function and diet status. Lyte replacement per nutritional support protocols.    5. F/U Glucose on am labs,if >180mg /dl,consider add SQBS/SSI.   Alinda Sierras, Dietetic Intern   Loretto Belinsky,RD,LD,CNSD [phone]

## 2008-02-20 NOTE — Progress Notes (Signed)
Ativan 1 mg given slow IVP for anxiety. Aware to call for assistance. Will monitor.

## 2008-02-20 NOTE — Progress Notes (Addendum)
INTERNAL MEDICINE PROGRESS NOTE    Subjective:   Daily Progress Note: 02/20/2008 2:36 PM    Pt still with RLQ pain, improved since admission.  No diarrhea.  Patient states that she would rather go ahead and try surgery.  Discussed plan of care.      Meds:  Current facility-administered medications   Medication Dose Route Frequency   ??? bisacodyl (DULCOLAX) tablet 20 mg  20 mg Oral ONCE   ??? polyethylene glycol (MIRALAX) powder 255 g  255 g Oral ONCE   ??? hydrocortisone sodium succinate (SOLUCORTEF) injection 50 mg  50 mg IntraVENous Q8H   ??? sodium chloride (NS) flush 10 mL  10 mL InterCATHeter Q8H   ??? heparin (porcine) pf 300 Units  300 Units InterCATHeter Q8H   ??? sodium chloride (NS) flush 10 mL  10 mL InterCATHeter PRN   ??? heparin (porcine) pf 300 Units  300 Units InterCATHeter PRN   ??? fat emulsion (LIPOSYN) 20 % infusion  21 mL/hr IntraVENous QPM   ??? TPN ADULT - CENTRAL     IntraVENous QPM   ??? dextrose 5% - 0.45% NaCl with KCl 20 mEq/L infusion    IntraVENous CONTINUOUS   ??? DISCONTD: dextrose 5% - 0.45% NaCl with KCl 20 mEq/L infusion    IntraVENous CONTINUOUS   ??? HYDROmorphone (DILAUDID) injection 1 mg  1 mg IntraVENous Q4H PRN   ??? naloxone (NARCAN) injection 0.4 mg  0.4 mg IntraVENous PRN   ??? acetaminophen (TYLENOL) suppository 650 mg  650 mg Rectal Q4H PRN   ??? diphenhydrAMINE (BENADRYL) injection 12.5 mg  12.5 mg IntraVENous Q4H PRN   ??? ondansetron (ZOFRAN) injection 4 mg  4 mg IntraVENous Q4H PRN   ??? promethazine (PHENERGAN) injection 12.5 mg  12.5 mg IntraVENous Q6H PRN   ??? enoxaparin (LOVENOX) injection 40 mg  40 mg SubCUTAneous Q24H   ??? levofloxacin (LEVAQUIN) 750 mg infusion   750 mg IntraVENous Q24H   ??? metronidazole (FLAGYL) IVPB 500 mg  500 mg IntraVENous Q8H   ??? lorazepam (ATIVAN) injection 1 mg  1 mg IntraVENous Q6H PRN   ??? hydrocodone-acetaminophen (LORTAB) 7.5-500 mg per tablet 1 Tab  1 Tab Oral Q4H PRN   ??? mesalamine EC (ASACOL) tablet 800 mg  800 mg Oral QID    ??? DISCONTD: dextrose 5 % - 0.45% NaCl infusion 1,000 mL  1,000 mL IntraVENous CONTINUOUS   ??? DISCONTD: lorazepam (ATIVAN) injection 1 mg  1 mg IntraVENous Q6H PRN   ??? DISCONTD: hydrocortisone sodium succinate (SOLUCORTEF) injection 100 mg  100 mg IntraVENous Q8H   ??? DISCONTD: HYDROmorphone (DILAUDID) injection 1 mg  1 mg IntraVENous Q4H PRN   ??? DISCONTD: ondansetron (ZOFRAN) injection 4 mg  4 mg IntraVENous Q4H PRN   ??? DISCONTD: dextrose 5% - 0.45% NaCl with KCl 20 mEq/L infusion    IntraVENous CONTINUOUS   ??? DISCONTD: lorazepam (ATIVAN) tablet 0.5 mg  0.5 mg Oral Q6H PRN   ??? DISCONTD: hydrocodone-acetaminophen (LORTAB) 5-500 mg per tablet 1 Tab  1 Tab Oral Q4H PRN   ??? DISCONTD: HYDROmorphone (DILAUDID) injection 0.5 mg  0.5 mg IntraVENous Q4H PRN   ??? DISCONTD: promethazine (PHENERGAN) injection 12.5 mg  12.5 mg IntraVENous Q6H PRN   ??? DISCONTD: lorazepam (ATIVAN) injection 0.5 mg  0.5 mg IntraVENous Q6H PRN   ??? DISCONTD: zolpidem (AMBIEN) tablet 5 mg  5 mg Oral QHS PRN   ??? DISCONTD: nicotine (NICODERM CQ) 21 mg/24 hr patch 1 Patch  1 Patch TransDERmal  Q24H   ??? DISCONTD: levofloxacin (LEVAQUIN) 750 mg infusion   750 mg IntraVENous Q24H   ??? DISCONTD: dextrose 5% - 0.45% NaCl with KCl 20 mEq/L infusion    IntraVENous CONTINUOUS   ??? DISCONTD: ondansetron (ZOFRAN) injection 4 mg  4 mg IntraVENous Q4H PRN   ??? DISCONTD: metronidazole (FLAGYL) IVPB 500 mg  500 mg IntraVENous Q6H   ??? DISCONTD: neomycin-bacitracin-polymyxin (NEOSPORIN) ointment    Topical BID   ??? DISCONTD: hydrocortisone sodium succinate (SOLUCORTEF) injection 100 mg  100 mg IntraVENous Q8H   ??? DISCONTD: mesalamine EC (ASACOL) tablet 800 mg  800 mg Oral QID           Review of Systems:  Pertinent per HPI    Objective:   Vitals:  BP 115/70   Pulse 96   Temp 97.9 ??F (36.6 ??C)   Resp 18   SpO2 98%   LMP 02/17/2008     O2 Device: Room air  Temp (24hrs), Avg:98.2 ??F (36.8 ??C), Min:97.8 ??F (36.6 ??C), Max:98.7 ??F (37.1 ??C)      I/O:  In: 240 (240 P.O.)  Out: -    In: 2400.6 (1270 P.O. 1130.6 I.V.)  Out: 800 (800 Urine)      Exam:  General appearance: alert, cooperative, no distress, appears stated age, young white female  Lungs: clear to auscultation bilaterally  Heart: regular rate and rhythm, S1, S2 normal, no murmur, click, rub or gallop  Abdomen: soft, RLQ tender. Bowel sounds normal. No masses,  no organomegaly  Extremities: extremities normal, atraumatic, no cyanosis or edema  Neurologic: Grossly normal, alert and oriented X 3    Data Review (Labs):  Recent Results (from the past 24 hour(s))   METABOLIC PANEL, BASIC    Collection Time 02/20/08  4:05 AM   Component Value Range   ??? Sodium 139  136 - 145 (MMOL/L)   ??? Potassium 3.5  3.5 - 5.1 (MMOL/L)   ??? Chloride 106  98 - 107 (MMOL/L)   ??? CO2 23  21 - 32 (MMOL/L)   ??? Anion gap 10  7 - 16 (mmol/L)   ??? Glucose 128 (*) 74 - 106 (MG/DL)   ??? BUN 4 (*) 7 - 18 (MG/DL)   ??? Creatinine 0.7  0.6 - 1.0 (MG/DL)   ??? GFR est AA >60  >60 (ml/min/1.27m2)   ??? GFR est non-AA >60  >60 (ml/min/1.70m2)   ??? Calcium 8.0 (*) 8.4 - 10.4 (MG/DL)   CBC W/ AUTOMATED DIFF    Collection Time 02/20/08  4:05 AM   Component Value Range   ??? WBC 15.7 (*) 4.0 - 10.5 (K/uL)   ??? RBC 4.01  3.86 - 5.18 (M/uL)   ??? HGB 11.1 (*) 11.7 - 15.0 (g/dL)   ??? HCT 34.1 (*) 37.6 - 48.3 (%)   ??? MCV 85.0  79.6 - 97.8 (FL)   ??? MCH 27.7  26.1 - 32.9 (PG)   ??? MCHC 32.6  31.4 - 35.0 (g/dL)   ??? RDW 13.2  11.9 - 14.6 (%)   ??? PLATELET 389  140 - 440 (K/uL)   ??? MPV 10.8  10.8 - 14.1 (FL)   ??? DF AUTOMATED     ??? NEUTROPHILS 84 (*) 43 - 78 (%)   ??? LYMPHOCYTES 13  13 - 44 (%)   ??? MONOCYTES 3 (*) 4.0 - 12.0 (%)   ??? EOSINOPHILS 0 (*) 0.5 - 7.8 (%)   ??? BASOPHILS 0  0.0 - 2.0 (%)   ???  ABSOLUTE NEUTS 13.1 (*) 1.7 - 8.2 (K/UL)   ??? ABSOLUTE LYMPHS 2.1  0.5 - 4.6 (K/UL)   ??? ABSOLUTE MONOS 0.4  0.1 - 1.3 (K/UL)   ??? ABSOLUTE EOSINS 0.0  0.0 - 0.8 (K/UL)   ??? ABSOLUTE BASOS 0.0  0.0 - 0.2 (K/UL)   ??? IMM. GRANS. 0.5  0.0 - 2.0 (%)   ??? ABS. IMM. GRANS. 0.1  0.0 - 2.0 (K/UL)           Assessment/Plan:    Patient Active Problem List   Diagnoses Date Noted   ??? Abscess of Intestine [569.5] Ileitis per CT  Cont flagyl and levaquin 02/18/2008   ??? Crohn Disease [555.9B] 02/18/2008   ??? Asthma, Exercise Induced [493.81E] 02/18/2008   ??? 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 Abdominal Pain, Right Lower Quadrant [789.03] 02/18/2008   ??? Tobacco Abuse [305.1U] 02/18/2008   ??? Anxiety State, Unspecified [300.00] 02/18/2008     Plan:  Continue Levaquin, Flagyl and steroids.  GI and surgery following.  Colo planned for AM.  Start TPN.    Care plan discussed with:  RN, patient, NT  Collier Bullock, NP  Hospitalist Attending:  Patient seen and examined. Agree with above  Gen Surgery note reviewed.  Colonoscopy in am  Likes her pain meds too much.  Lynne Logan, MD

## 2008-02-20 NOTE — Progress Notes (Signed)
PICC Placement Note    PRE-PROCEDURE VERIFICATION  Correct Procedure: yes  Correct Site:  yes  Temperature:   Temp: 98.2 ??F (36.8 ??C), Temperature Source:   Temp Source: Oral  Recent Labs   Basename 02/20/08 0405   ??? BUN 4*   ??? CREA 0.7   ??? PLT 389   ??? INR --   ??? WBC 15.7*       Allergies: Penicillin g  Education materials for PICC Care given to family: no. See Patient Education activity for further details.  PICC Booklet placed on bedside chart  PROCEDURE DETAIL  A double lumen PICC line was started for vascular access. The following documentation is in addition to the PICC properties in the lines/airways flowsheet :  Lot #: [id number]  Xylocaine used: yes  Mid-Arm Circumference: 26 (cm)  Internal Catheter Length: 33 (cm)  external Catheter Total Length: 6 (cm)    The placement was verified by X-ray: yes. The x-ray results state the tip location is on the right side and the tip overlies the upper superior vena cava. Celeste RN was notified that the PICC line is verified and OK to use.    Line is okay to use: yes    Carney Harder, RN

## 2008-02-20 NOTE — Progress Notes (Signed)
Problem: Interdisciplinary Rounds  Goal: Interdisciplinary Rounds  Interdisciplinary team (Nursing, PT,Spiritual Care,  Dr Armond Hang Management and Social Services) met with patient.  Discussed plan of care and possible D/C needs.  PICC line TPN, Colonoscopy tomorrow.  Very tearful.  Difficult home situation.  Lives with husband and 2 small children.  Team to follow

## 2008-02-20 NOTE — Progress Notes (Signed)
Gastrointestinal Progress Note    02/20/2008    Admit Date: 02/19/2008    Subjective:     Patient is on clear liqs    Pain: Still has RLQ abd pain, maybe a little better since adm. Tolerating clear liqs.             Current facility-administered medications   Medication Dose Route Frequency   ??? bisacodyl (DULCOLAX) tablet 20 mg  20 mg Oral ONCE   ??? polyethylene glycol (MIRALAX) powder 255 g  255 g Oral ONCE   ??? HYDROmorphone (DILAUDID) injection 1 mg  1 mg IntraVENous Q4H PRN   ??? naloxone (NARCAN) injection 0.4 mg  0.4 mg IntraVENous PRN   ??? acetaminophen (TYLENOL) suppository 650 mg  650 mg Rectal Q4H PRN   ??? diphenhydrAMINE (BENADRYL) injection 12.5 mg  12.5 mg IntraVENous Q4H PRN   ??? ondansetron (ZOFRAN) injection 4 mg  4 mg IntraVENous Q4H PRN   ??? promethazine (PHENERGAN) injection 12.5 mg  12.5 mg IntraVENous Q6H PRN   ??? enoxaparin (LOVENOX) injection 40 mg  40 mg SubCUTAneous Q24H   ??? levofloxacin (LEVAQUIN) 750 mg infusion   750 mg IntraVENous Q24H   ??? metronidazole (FLAGYL) IVPB 500 mg  500 mg IntraVENous Q8H   ??? hydrocortisone sodium succinate (SOLUCORTEF) injection 100 mg  100 mg IntraVENous Q8H   ??? lorazepam (ATIVAN) injection 1 mg  1 mg IntraVENous Q6H PRN   ??? hydrocodone-acetaminophen (LORTAB) 7.5-500 mg per tablet 1 Tab  1 Tab Oral Q4H PRN   ??? mesalamine EC (ASACOL) tablet 800 mg  800 mg Oral QID   ??? dextrose 5% - 0.45% NaCl with KCl 20 mEq/L infusion    IntraVENous CONTINUOUS   ??? DISCONTD: dextrose 5 % - 0.45% NaCl infusion 1,000 mL  1,000 mL IntraVENous CONTINUOUS   ??? DISCONTD: lorazepam (ATIVAN) injection 1 mg  1 mg IntraVENous Q6H PRN   ??? DISCONTD: HYDROmorphone (DILAUDID) injection 1 mg  1 mg IntraVENous Q4H PRN   ??? DISCONTD: ondansetron (ZOFRAN) injection 4 mg  4 mg IntraVENous Q4H PRN   ??? DISCONTD: lorazepam (ATIVAN) tablet 0.5 mg  0.5 mg Oral Q6H PRN   ??? DISCONTD: hydrocodone-acetaminophen (LORTAB) 5-500 mg per tablet 1 Tab  1 Tab Oral Q4H PRN    ??? DISCONTD: HYDROmorphone (DILAUDID) injection 0.5 mg  0.5 mg IntraVENous Q4H PRN   ??? DISCONTD: promethazine (PHENERGAN) injection 12.5 mg  12.5 mg IntraVENous Q6H PRN   ??? DISCONTD: lorazepam (ATIVAN) injection 0.5 mg  0.5 mg IntraVENous Q6H PRN   ??? DISCONTD: zolpidem (AMBIEN) tablet 5 mg  5 mg Oral QHS PRN   ??? DISCONTD: nicotine (NICODERM CQ) 21 mg/24 hr patch 1 Patch  1 Patch TransDERmal Q24H   ??? DISCONTD: levofloxacin (LEVAQUIN) 750 mg infusion   750 mg IntraVENous Q24H   ??? DISCONTD: dextrose 5% - 0.45% NaCl with KCl 20 mEq/L infusion    IntraVENous CONTINUOUS   ??? DISCONTD: ondansetron (ZOFRAN) injection 4 mg  4 mg IntraVENous Q4H PRN   ??? DISCONTD: metronidazole (FLAGYL) IVPB 500 mg  500 mg IntraVENous Q6H   ??? DISCONTD: neomycin-bacitracin-polymyxin (NEOSPORIN) ointment    Topical BID   ??? DISCONTD: hydrocortisone sodium succinate (SOLUCORTEF) injection 100 mg  100 mg IntraVENous Q8H   ??? DISCONTD: mesalamine EC (ASACOL) tablet 800 mg  800 mg Oral QID          Objective:     Blood pressure 109/68, pulse 79, temperature 98.2 ??F (36.8 ??C), resp. rate  16, last menstrual period 02/17/2008, SpO2 97.00%.         In: 2400.6 (1270 P.O. 1130.6 I.V.)  Out: 800 (800 Urine)      EXAM: A and O. CV:RRR  Abd: nondistended. Nl bs, soft and tender in the RLQ without rebound or guarding.    Data Review  Recent Results (from the past 24 hour(s))   METABOLIC PANEL, BASIC    Collection Time 02/20/08  4:05 AM   Component Value Range   ??? Sodium 139  136 - 145 (MMOL/L)   ??? Potassium 3.5  3.5 - 5.1 (MMOL/L)   ??? Chloride 106  98 - 107 (MMOL/L)   ??? CO2 23  21 - 32 (MMOL/L)   ??? Anion gap 10  7 - 16 (mmol/L)   ??? Glucose 128 (*) 74 - 106 (MG/DL)   ??? BUN 4 (*) 7 - 18 (MG/DL)   ??? Creatinine 0.7  0.6 - 1.0 (MG/DL)   ??? GFR est AA >60  >60 (ml/min/1.2m2)   ??? GFR est non-AA >60  >60 (ml/min/1.39m2)   ??? Calcium 8.0 (*) 8.4 - 10.4 (MG/DL)   CBC W/ AUTOMATED DIFF    Collection Time 02/20/08  4:05 AM   Component Value Range    ??? WBC 15.7 (*) 4.0 - 10.5 (K/uL)   ??? RBC 4.01  3.86 - 5.18 (M/uL)   ??? HGB 11.1 (*) 11.7 - 15.0 (g/dL)   ??? HCT 34.1 (*) 37.6 - 48.3 (%)   ??? MCV 85.0  79.6 - 97.8 (FL)   ??? MCH 27.7  26.1 - 32.9 (PG)   ??? MCHC 32.6  31.4 - 35.0 (g/dL)   ??? RDW 13.2  11.9 - 14.6 (%)   ??? PLATELET 389  140 - 440 (K/uL)   ??? MPV 10.8  10.8 - 14.1 (FL)   ??? DF AUTOMATED     ??? NEUTROPHILS 84 (*) 43 - 78 (%)   ??? LYMPHOCYTES 13  13 - 44 (%)   ??? MONOCYTES 3 (*) 4.0 - 12.0 (%)   ??? EOSINOPHILS 0 (*) 0.5 - 7.8 (%)   ??? BASOPHILS 0  0.0 - 2.0 (%)   ??? ABSOLUTE NEUTS 13.1 (*) 1.7 - 8.2 (K/UL)   ??? ABSOLUTE LYMPHS 2.1  0.5 - 4.6 (K/UL)   ??? ABSOLUTE MONOS 0.4  0.1 - 1.3 (K/UL)   ??? ABSOLUTE EOSINS 0.0  0.0 - 0.8 (K/UL)   ??? ABSOLUTE BASOS 0.0  0.0 - 0.2 (K/UL)   ??? IMM. GRANS. 0.5  0.0 - 2.0 (%)   ??? ABS. IMM. GRANS. 0.1  0.0 - 2.0 (K/UL)         Assessment:     Patient Active Hospital Problem List:   * No active hospital problems. *     Crohn's ileitis with RLQ pain. Some improvement since adm, but no resolution.   Plan:   Will proceed with prep today and colon tomorrow.  Will decrease dose of Steroids.

## 2008-02-20 NOTE — Progress Notes (Signed)
Dilaudid 1 mg given slow IVP for c/o pain. Aware to call for assistance. Will monitor.

## 2008-02-21 LAB — METABOLIC PANEL, BASIC
Anion gap: 5 mmol/L — ABNORMAL LOW (ref 7–16)
BUN: 5 MG/DL — ABNORMAL LOW (ref 7–18)
CO2: 30 MMOL/L (ref 21–32)
Calcium: 7.7 MG/DL — ABNORMAL LOW (ref 8.4–10.4)
Chloride: 105 MMOL/L (ref 98–107)
Creatinine: 0.7 MG/DL (ref 0.6–1.0)
GFR est AA: >60 mL/min/{1.73_m2} (ref >60)
GFR est non-AA: >60 mL/min/{1.73_m2} (ref >60)
Glucose: 116 MG/DL — ABNORMAL HIGH (ref 74–106)
Potassium: 3.1 MMOL/L — ABNORMAL LOW (ref 3.5–5.1)
Sodium: 140 MMOL/L (ref 136–145)

## 2008-02-21 LAB — TRIGLYCERIDE: Triglyceride: 89 MG/DL (ref 35–150)

## 2008-02-21 LAB — MAGNESIUM: Magnesium: 2.1 MG/DL (ref 1.8–2.4)

## 2008-02-21 LAB — PHOSPHORUS: Phosphorus: 2.6 MG/DL (ref 2.5–4.5)

## 2008-02-21 MED ADMIN — HYDROmorphone (DILAUDID) injection 0.5 mg: INTRAVENOUS | @ 14:00:00 | NDC 00641012125

## 2008-02-21 MED ADMIN — metronidazole (FLAGYL) IVPB 500 mg: INTRAVENOUS | @ 14:00:00 | NDC 00338105548

## 2008-02-21 MED ADMIN — levofloxacin (LEVAQUIN) 750 mg infusion: INTRAVENOUS | @ 12:00:00 | NDC 00045006601

## 2008-02-21 MED ADMIN — mesalamine EC (ASACOL) tablet 800 mg: ORAL | @ 18:00:00 | NDC 00149075215

## 2008-02-21 MED ADMIN — mesalamine EC (ASACOL) tablet 800 mg: ORAL | @ 12:00:00 | NDC 00149075215

## 2008-02-21 MED ADMIN — heparin (porcine) pf 300 Units: @ 11:00:00

## 2008-02-21 MED ADMIN — mesalamine EC (ASACOL) tablet 800 mg: ORAL | @ 14:00:00 | NDC 68258912901

## 2008-02-21 MED ADMIN — sodium chloride (NS) flush 10 mL: @ 02:00:00 | NDC 17474300205

## 2008-02-21 MED ADMIN — lorazepam (ATIVAN) injection 1 mg: INTRAVENOUS | @ 11:00:00 | NDC 10019010239

## 2008-02-21 MED ADMIN — dextrose 5% - 0.45% NaCl with KCl 20 mEq/L infusion: INTRAVENOUS | @ 05:00:00 | NDC 00409790209

## 2008-02-21 MED ADMIN — lorazepam (ATIVAN) injection 1 mg: INTRAVENOUS | @ 19:00:00 | NDC 10019010239

## 2008-02-21 MED ADMIN — mesalamine EC (ASACOL) tablet 800 mg: ORAL | @ 02:00:00 | NDC 00149075215

## 2008-02-21 MED ADMIN — hydrocortisone sodium succinate (SOLUCORTEF) injection 50 mg: INTRAVENOUS | @ 08:00:00 | NDC 00009090013

## 2008-02-21 MED ADMIN — lorazepam (ATIVAN) injection 1 mg: INTRAVENOUS | @ 05:00:00 | NDC 10019010239

## 2008-02-21 MED ADMIN — heparin (porcine) pf 300 Units: @ 02:00:00

## 2008-02-21 MED ADMIN — potassium chloride 40 mEq IVPB: INTRAVENOUS | @ 12:00:00 | NDC 00409707726

## 2008-02-21 MED ADMIN — diphenhydrAMINE (BENADRYL) injection 50 mg: INTRAVENOUS | @ 16:00:00 | NDC 00641037621

## 2008-02-21 MED ADMIN — mesalamine EC (ASACOL) tablet 800 mg: ORAL | @ 22:00:00 | NDC 00149075215

## 2008-02-21 MED ADMIN — nicotine (NICODERM CQ) 21 mg/24 hr patch 1 Patch: TRANSDERMAL | @ 14:00:00 | NDC 00067512609

## 2008-02-21 MED ADMIN — fat emulsion (LIPOSYN) 20 % infusion: INTRAVENOUS | @ 22:00:00 | NDC 00338051902

## 2008-02-21 MED ADMIN — HYDROmorphone (DILAUDID) injection 0.5 mg: INTRAVENOUS | @ 01:00:00 | NDC 00641012125

## 2008-02-21 MED ADMIN — enoxaparin (LOVENOX) injection 40 mg: SUBCUTANEOUS | @ 22:00:00 | NDC 00075062040

## 2008-02-21 MED ADMIN — HYDROmorphone (DILAUDID) injection 0.5 mg: INTRAVENOUS | @ 08:00:00 | NDC 00641012125

## 2008-02-21 MED ADMIN — metronidazole (FLAGYL) IVPB 500 mg: INTRAVENOUS | @ 08:00:00 | NDC 00338105548

## 2008-02-21 MED ADMIN — midazolam (VERSED) injection 0.25-5 mg: INTRAVENOUS | @ 16:00:00 | NDC 55390013705

## 2008-02-21 MED ADMIN — potassium chloride 40 mEq IVPB: INTRAVENOUS | @ 14:00:00 | NDC 72888004213

## 2008-02-21 MED ADMIN — HYDROmorphone (DILAUDID) injection 0.5 mg: INTRAVENOUS | @ 21:00:00 | NDC 00641012125

## 2008-02-21 MED ADMIN — TPN ADULT - CENTRAL: INTRAVENOUS | @ 22:00:00 | NDC 63323017015

## 2008-02-21 MED ADMIN — 0.9% sodium chloride infusion: INTRAVENOUS | @ 15:00:00 | NDC 00409798309

## 2008-02-21 MED ADMIN — metronidazole (FLAGYL) IVPB 500 mg: INTRAVENOUS | @ 22:00:00 | NDC 00338105548

## 2008-02-21 MED ADMIN — hydrocortisone sodium succinate (SOLUCORTEF) injection 50 mg: INTRAVENOUS | @ 14:00:00 | NDC 00009090013

## 2008-02-21 MED ADMIN — hydrocodone-acetaminophen (LORTAB) 7.5-500 mg per tablet 1 Tab: ORAL | @ 12:00:00 | NDC 00406035862

## 2008-02-21 MED ADMIN — midazolam (VERSED) injection 0.25-5 mg: INTRAVENOUS | @ 16:00:00 | NDC 10019002837

## 2008-02-21 MED ADMIN — hydrocodone-acetaminophen (LORTAB) 7.5-500 mg per tablet 1 Tab: ORAL | @ 05:00:00 | NDC 00406035862

## 2008-02-21 MED ADMIN — meperidine (DEMEROL) injection 100 mg: INTRAVENOUS | @ 16:00:00 | NDC 10019016244

## 2008-02-21 MED ADMIN — hydrocortisone sodium succinate (SOLUCORTEF) injection 50 mg: INTRAVENOUS | @ 22:00:00 | NDC 00009090013

## 2008-02-21 MED ADMIN — sodium chloride (NS) flush 10 mL: @ 11:00:00 | NDC 87701099893

## 2008-02-21 MED FILL — DIPHENHYDRAMINE HCL 50 MG/ML IJ SOLN: 50 mg/mL | INTRAMUSCULAR | Qty: 1

## 2008-02-21 MED FILL — MIDAZOLAM 1 MG/ML IJ SOLN: 1 mg/mL | INTRAMUSCULAR | Qty: 5

## 2008-02-21 MED FILL — AMINOSYN II 10 % IV: 10 % | INTRAVENOUS | Qty: 864

## 2008-02-21 MED FILL — DEMEROL (PF) 100 MG/ML INJECTION SYRINGE: 100 mg/mL | INTRAMUSCULAR | Qty: 1

## 2008-02-21 NOTE — Progress Notes (Signed)
Problem: Nutrition Deficit  Goal: *Optimize nutritional status  Nutrition F/U:  Evaluation:  Remains TPN dependent d/t altered GI function/NPO status for colonoscopy today. New wt of 151# and ht of 5???6???(1/13), nutritional needs =1716-2059 kcals/day(~25-30kcal/kg BW) and 70-89g pro/day (1.2-1.5g pro/kg IBW). Current TPN meets ~79% estimated kcal needs and ~100% of estimated protein needs. Currently on D5 ?? NS with KCl(75mEq) @25mL /hr- providing an additional 30 kcals.. Labs remarkable for low potassium(3.1)-possibly d/t bowel prep yesterday, received bolus of KCl ( ) today and will increase K in TPN, triglyceride(89)-will continue IV lipids. Continues on steroids. Last documented BM (1/13) x1.   Intervention:  1. Increase TPN @80mL /hr to D14%/ 4.5AA with of 20% lipids/day- providing 1759kcals, 268g CHO, and 86g pro.  3. Increase KCl in TPN to .   4. F/U diet status, wts, results from colonoscopy, and lyte replacement per nutritional support protocols.  5. F/U Glucose on am labs, if >180mg /dl, consider adding SQBS/SSI.   Christine Castillo, Dietetic Intern  Christine Castillo [phone]

## 2008-02-21 NOTE — Progress Notes (Signed)
Afebrile and soft abdomen . For colonoscopy today and hopefully can defer any surgical intervention until it is determined that she has actually failed medical treatment after adequate compliance.

## 2008-02-21 NOTE — Progress Notes (Signed)
TRANSFER - IN REPORT:    Verbal report received from [place] dunkin(name) on [employer]  being received from gi lab(unit) for routine progression of care      Report consisted of patient???s Situation, Background, Assessment and   Recommendations(SBAR).     Information from the following report(s) Kardex, Procedure Summary, Intake/Output, MAR, Accordion and Recent Results was reviewed with the receiving nurse.    Opportunity for questions and clarification was provided.      Assessment completed upon patient???s arrival to unit and care assumed.

## 2008-02-21 NOTE — Progress Notes (Addendum)
INTERNAL MEDICINE PROGRESS NOTE    Subjective:   Daily Progress Note: 02/21/2008 8:34 AM    Pt sitting up in bed, RN at bedside.  Patient still with RLQ pain 7/10, requesting pain medications.  Has prepped for Colo during night.  Patient states that she would rather go ahead and try surgery.  Again, discussed surgeon recommendations to try compliance with PO first before any surgical intervention.      Meds:  Current facility-administered medications   Medication Dose Route Frequency   ??? potassium chloride 40 mEq IVPB   40 mEq IntraVENous ONCE   ??? potassium chloride 40 mEq IVPB   40 mEq IntraVENous ONCE   ??? bisacodyl (DULCOLAX) tablet 20 mg  20 mg Oral ONCE   ??? polyethylene glycol (MIRALAX) powder 255 g  255 g Oral ONCE   ??? hydrocortisone sodium succinate (SOLUCORTEF) injection 50 mg  50 mg IntraVENous Q8H   ??? sodium chloride (NS) flush 10 mL  10 mL InterCATHeter Q8H   ??? heparin (porcine) pf 300 Units  300 Units InterCATHeter Q8H   ??? sodium chloride (NS) flush 10 mL  10 mL InterCATHeter PRN   ??? heparin (porcine) pf 300 Units  300 Units InterCATHeter PRN   ??? fat emulsion (LIPOSYN) 20 % infusion  21 mL/hr IntraVENous QPM   ??? TPN ADULT - CENTRAL     IntraVENous QPM   ??? dextrose 5% - 0.45% NaCl with KCl 20 mEq/L infusion    IntraVENous CONTINUOUS   ??? HYDROmorphone (DILAUDID) injection 0.5 mg  0.5 mg IntraVENous Q6H PRN   ??? nicotine (NICODERM CQ) 21 mg/24 hr patch 1 Patch  1 Patch TransDERmal DAILY   ??? DISCONTD: dextrose 5% - 0.45% NaCl with KCl 20 mEq/L infusion    IntraVENous CONTINUOUS   ??? naloxone (NARCAN) injection 0.4 mg  0.4 mg IntraVENous PRN   ??? acetaminophen (TYLENOL) suppository 650 mg  650 mg Rectal Q4H PRN   ??? diphenhydrAMINE (BENADRYL) injection 12.5 mg  12.5 mg IntraVENous Q4H PRN   ??? ondansetron (ZOFRAN) injection 4 mg  4 mg IntraVENous Q4H PRN   ??? promethazine (PHENERGAN) injection 12.5 mg  12.5 mg IntraVENous Q6H PRN   ??? enoxaparin (LOVENOX) injection 40 mg  40 mg SubCUTAneous Q24H    ??? levofloxacin (LEVAQUIN) 750 mg infusion   750 mg IntraVENous Q24H   ??? metronidazole (FLAGYL) IVPB 500 mg  500 mg IntraVENous Q8H   ??? lorazepam (ATIVAN) injection 1 mg  1 mg IntraVENous Q6H PRN   ??? hydrocodone-acetaminophen (LORTAB) 7.5-500 mg per tablet 1 Tab  1 Tab Oral Q4H PRN   ??? mesalamine EC (ASACOL) tablet 800 mg  800 mg Oral QID   ??? DISCONTD: HYDROmorphone (DILAUDID) injection 1 mg  1 mg IntraVENous Q4H PRN   ??? DISCONTD: hydrocortisone sodium succinate (SOLUCORTEF) injection 100 mg  100 mg IntraVENous Q8H   ??? DISCONTD: dextrose 5% - 0.45% NaCl with KCl 20 mEq/L infusion    IntraVENous CONTINUOUS           Review of Systems:  Pertinent per HPI    Objective:   Vitals:  BP 118/68   Pulse 52   Temp 97.5 ??F (36.4 ??C)   Resp 16   Ht 5\' 6"  (1.676 m)   Wt 151 lb 8 oz (68.72 kg)   SpO2 96%   LMP 02/17/2008     O2 Device: Room air  Temp (24hrs), Avg:97.7 ??F (36.5 ??C), Min:97.5 ??F (36.4 ??C), Max:97.9 ??F (36.6 ??C)  I/O:     In: 5244.6 (2200 P.O. 2454.6 I.V.)  Out: 3200 (3200 Urine)      Exam:  General appearance: alert, cooperative, no distress, appears stated age, young white female  Lungs: clear to auscultation bilaterally  Heart: regular rate and rhythm, S1, S2 normal, no murmur, click, rub or gallop  Abdomen: soft, RLQ tender. Bowel sounds normal. No masses,  no organomegaly  Extremities: extremities normal, atraumatic, no cyanosis or edema  Neurologic: Grossly normal, alert and oriented X 3    Data Review (Labs):  Recent Results (from the past 24 hour(s))   METABOLIC PANEL, BASIC    Collection Time 02/21/08  4:00 AM   Component Value Range   ??? Sodium 140  136 - 145 (MMOL/L)   ??? Potassium 3.1 (*) 3.5 - 5.1 (MMOL/L)   ??? Chloride 105  98 - 107 (MMOL/L)   ??? CO2 30  21 - 32 (MMOL/L)   ??? Anion gap 5 (*) 7 - 16 (mmol/L)   ??? Glucose 116 (*) 74 - 106 (MG/DL)   ??? BUN 5 (*) 7 - 18 (MG/DL)   ??? Creatinine 0.7  0.6 - 1.0 (MG/DL)   ??? GFR est AA >60  >60 (ml/min/1.47m2)   ??? GFR est non-AA >60  >60 (ml/min/1.59m2)    ??? Calcium 7.7 (*) 8.4 - 10.4 (MG/DL)   MAGNESIUM    Collection Time 02/21/08  4:00 AM   Component Value Range   ??? Magnesium 2.1  1.8 - 2.4 (MG/DL)   PHOSPHORUS    Collection Time 02/21/08  4:00 AM   Component Value Range   ??? Phosphorus 2.6  2.5 - 4.5 (MG/DL)   TRIGLYCERIDE    Collection Time 02/21/08  4:00 AM   Component Value Range   ??? Triglyceride 89  35 - 150 (MG/DL)           Assessment/Plan:   Patient Active Problem List   Diagnoses Date Noted   ??? Abscess of Intestine [569.5] Ileitis per CT  Cont flagyl and levaquin 02/18/2008   ??? Crohn Disease [555.9B] 02/18/2008   ??? Asthma, Exercise Induced [493.81E] 02/18/2008   ??? 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 Abdominal Pain, Right Lower Quadrant [789.03] 02/18/2008   ??? Tobacco Abuse [305.1U] 02/18/2008   ??? Anxiety State, Unspecified [300.00] 02/18/2008     Plan:  Replete K.  Colo planned for today per GI.  Continue Levaquin, Flagyl and steroids.  Surgery following.      Care plan discussed with:  RN, patient, NT  Collier Bullock, NP    Hospitalist Attending:  Patient seen and examined. Agree with above  Case discussed with Dr. Jordan Likes.  IV abx and meds tonight. Change to PO in am, 10-14 day course of abx  Full liquids in am   Stop IV narcotics tonight. Patient has drug seeking behavior  Patient does not need TPN  She states that she WILL NOT take po steroids.  Will start on valtrex  for chronic suppression of genital herpes as she states that the crohns meds cause her to have flare  No surgery intended per Dr. Henreitta Leber note  Home tomorrow afternoon.    Lynne Logan, MD

## 2008-02-21 NOTE — Progress Notes (Signed)
TRANSFER - IN REPORT:    Verbal report received from [employer] (name) on [employer]  being received from 201(unit) for routine progression of care      Report consisted of patient???s Situation, Background, Assessment and   Recommendations(SBAR).     Information from the following report(s) Kardex was reviewed with the receiving nurse.    Opportunity for questions and clarification was provided.      Assessment completed upon patient???s arrival to unit and care assumed.

## 2008-02-21 NOTE — Progress Notes (Addendum)
[facility]. Last Name: [facility]. First Name: Christine Castillo Drive MR#: [id number] / Admit#: [id number]   [place], [place] [address] DOB: July 11, 1980 / Age: 28  Attn.: Christine Castillo  Location: 95 [phone] [id number]        Case Management - Progress Note  Initial Open Date: 02/20/2008   Case Manager: Christine Cage, RN    Initial Open Date: 02/20/2008  Social Worker: Christine Allegra LISW    Expected Date of Discharge:   Transferred From: Home  ECF Bed Held Until:   Bed Held By:     Power of Attorney:   POA/Guardian/Conservator Capacity:   Primary Caregiver: spouse Christine Castillo [phone]  Living Arrangements: Own Home    Source of Income: None/unable to assess  Payee:   Psychosocial History:   Cultural/Religious/Language Issues:   Education Level:   ADLS/Current Living Arrangements Issues: Patient lives with her spouise and   two year old twins. Mother in in the area. Patient moved to [place] last   August. I PTA.    Past Providers: chronic crohns multiple hospitalizations.    Will patient perform self care at discharge? Y    Anticipated Discharge Disposition Goal: Return to admission address    Assessment/Plan:     02/21/2008 13:16Colonoscopy was normal. Patient to start clears again. Surgery   not indicated at this time. No DC needs. Following. Christine Castillo    02/20/2008 95:28 yo MWF admitted with abdominal pain. Patient is alert and   oriented times three and has been independent prior to admission. Patient   livves with spouse and two children. Full time mom and sick frequently. Spouse   works in Psychologist, educational usually Conservation officer, historic buildings) and his boss iss keeping him on   local jobs to be near the hospital. Patient's mom is taking care of the   children. Christine Castillo is weepy and kept excusing herself stating it was the   steroids that are making her so emotional. Plan is to place a PICC, start TPN,   ? colonoscopy tomorrow and then make a decision about surgery. Following and    will support. Patient has medicaid. Christine Castillo           Resources at Discharge:           Service Providers at Discharge:

## 2008-02-21 NOTE — Progress Notes (Signed)
GI: Colonoscopy: normal throughout. Could not intubate the t. Ileum.  Cont. With medical therapy. May try to begin clear liqs.

## 2008-02-21 NOTE — Progress Notes (Signed)
TRANSFER - OUT REPORT:    Verbal report given to [employer], RN(name) on [employer]  being transferred to 201(unit) for routine progression of care       Report consisted of patient???s Situation, Background, Assessment and   Recommendations(SBAR).     Information from the following report(s) Procedure Summary was reviewed with the receiving nurse.    Opportunity for questions and clarification was provided.  Transport called.

## 2008-02-22 LAB — METABOLIC PANEL, BASIC
Anion gap: 5 mmol/L — ABNORMAL LOW (ref 7–16)
BUN: 7 MG/DL (ref 7–18)
CO2: 31 MMOL/L (ref 21–32)
Calcium: 7.8 MG/DL — ABNORMAL LOW (ref 8.4–10.4)
Chloride: 104 MMOL/L (ref 98–107)
Creatinine: 0.7 MG/DL (ref 0.6–1.0)
GFR est AA: >60 mL/min/{1.73_m2} (ref >60)
GFR est non-AA: >60 mL/min/{1.73_m2} (ref >60)
Glucose: 101 MG/DL (ref 74–106)
Potassium: 3.1 MMOL/L — ABNORMAL LOW (ref 3.5–5.1)
Sodium: 140 MMOL/L (ref 136–145)

## 2008-02-22 LAB — PHOSPHORUS: Phosphorus: 2.7 MG/DL (ref 2.5–4.5)

## 2008-02-22 LAB — MAGNESIUM: Magnesium: 2.2 MG/DL (ref 1.8–2.4)

## 2008-02-22 MED ORDER — METRONIDAZOLE 500 MG TAB
500 mg | ORAL_TABLET | Freq: Three times a day (TID) | ORAL | Status: AC
Start: 2008-02-22 — End: 2008-03-03

## 2008-02-22 MED ORDER — LEVOFLOXACIN 500 MG TAB
500 mg | ORAL_TABLET | Freq: Every day | ORAL | Status: AC
Start: 2008-02-22 — End: 2008-03-03

## 2008-02-22 MED ORDER — MESALAMINE 400 MG TAB, DELAYED RELEASE
400 mg | ORAL_TABLET | Freq: Four times a day (QID) | ORAL | Status: AC
Start: 2008-02-22 — End: 2008-03-23

## 2008-02-22 MED ORDER — VALACYCLOVIR 500 MG TAB
500 mg | ORAL_TABLET | Freq: Every day | ORAL | Status: DC
Start: 2008-02-22 — End: 2008-03-06

## 2008-02-22 MED ORDER — POTASSIUM CHLORIDE SR 20 MEQ TAB, PARTICLES/CRYSTALS
20 mEq | ORAL_TABLET | Freq: Two times a day (BID) | ORAL | Status: AC
Start: 2008-02-22 — End: 2008-02-25

## 2008-02-22 MED ADMIN — hydrocodone-acetaminophen (LORTAB) 7.5-500 mg per tablet 1 Tab: ORAL | @ 07:00:00 | NDC 00406035862

## 2008-02-22 MED ADMIN — mesalamine EC (ASACOL) tablet 800 mg: ORAL | @ 13:00:00 | NDC 00149075215

## 2008-02-22 MED ADMIN — lorazepam (ATIVAN) injection 1 mg: INTRAVENOUS | @ 16:00:00 | NDC 10019010239

## 2008-02-22 MED ADMIN — hydrocodone-acetaminophen (LORTAB) 7.5-500 mg per tablet 1 Tab: ORAL | @ 11:00:00 | NDC 00406035862

## 2008-02-22 MED ADMIN — hydrocodone-acetaminophen (LORTAB) 7.5-500 mg per tablet 1 Tab: ORAL | @ 16:00:00 | NDC 00406035862

## 2008-02-22 MED ADMIN — levofloxacin (LEVAQUIN) tablet 500 mg: ORAL | @ 11:00:00 | NDC 50458092510

## 2008-02-22 MED ADMIN — hydrocortisone sodium succinate (SOLUCORTEF) injection 50 mg: INTRAVENOUS | @ 16:00:00 | NDC 00009090013

## 2008-02-22 MED ADMIN — nicotine (NICODERM CQ) 21 mg/24 hr patch 1 Patch: TRANSDERMAL | @ 13:00:00 | NDC 00067512609

## 2008-02-22 MED ADMIN — mesalamine EC (ASACOL) tablet 800 mg: ORAL | @ 18:00:00 | NDC 00149075215

## 2008-02-22 MED ADMIN — mesalamine EC (ASACOL) tablet 800 mg: ORAL | @ 03:00:00 | NDC 00149075215

## 2008-02-22 MED ADMIN — hydrocodone-acetaminophen (LORTAB) 7.5-500 mg per tablet 1 Tab: ORAL | @ 01:00:00 | NDC 00406035862

## 2008-02-22 MED ADMIN — promethazine (PHENERGAN) injection 12.5 mg: INTRAVENOUS | @ 13:00:00 | NDC 00641092821

## 2008-02-22 MED ADMIN — metronidazole (FLAGYL) tablet 500 mg: ORAL | @ 18:00:00 | NDC 63739017610

## 2008-02-22 MED ADMIN — hydrocortisone sodium succinate (SOLUCORTEF) injection 50 mg: INTRAVENOUS | @ 07:00:00 | NDC 00009090013

## 2008-02-22 MED ADMIN — metronidazole (FLAGYL) tablet 500 mg: ORAL | @ 13:00:00 | NDC 63739017610

## 2008-02-22 MED ADMIN — lorazepam (ATIVAN) injection 1 mg: INTRAVENOUS | @ 03:00:00 | NDC 10019010239

## 2008-02-22 MED ADMIN — promethazine (PHENERGAN) injection 12.5 mg: INTRAVENOUS | @ 04:00:00 | NDC 00641092821

## 2008-02-22 MED FILL — LEVAQUIN 500 MG TABLET: 500 mg | ORAL | Qty: 1

## 2008-02-22 MED FILL — AMINOSYN II 10 % IV: 10 % | INTRAVENOUS | Qty: 864

## 2008-02-22 MED FILL — METRONIDAZOLE 500 MG TAB: 500 mg | ORAL | Qty: 1

## 2008-02-22 NOTE — Progress Notes (Signed)
Pt given discharge instructions. Pt requests pain medication and ativan prescription. Dr. Jordan Likes states to request this from the hospitalist. Paged Dr. Julius Bowels.

## 2008-02-22 NOTE — Progress Notes (Signed)
Dr. Julius Bowels returned call and declines to write ativan or narcotic prescription for pt. Relayed this to pt and pt burst into tears. Pt calmed down and states ready for discharge. Discharge instructions reviewed with patient and caregiver. Opportunity for questions allowed. Patient signed and given copies of discharge instructions. Signature witnessed by 2nd Charity fundraiser. Pt verbalizes understanding of all instructions and follow-up appointment. Prescriptions given to patient. Patient discharged to home with belongings, discharge papers and prescriptions. No distress or changes noted. Pt transported to patient discharge area via wheelchair accompanied by hospital personel.

## 2008-02-22 NOTE — Discharge Summary (Signed)
Physician Discharge Summary     Patient ID:  Christine Castillo  [id number]  27 y.o.  1980/11/22    Admit date: 02/19/2008  Discharge date and time: No discharge date for patient encounter.   Admission Diagnoses: CROHNS  Discharge Diagnoses:  abdominal abscess, Crohn's    Hospital Course: patient admitted for abd pain and was found to have abscess presumably of ileum.  She responded well to abx and did not require surgical intervention.  GI did scope patient but was unable to advance into the ileum.  Patient is currently doing well and tolerating full liq diet.    PCP: Phys Other, MD, MD  Consults: GI and general surgery    Significant Diagnostic Studies: CT abd/pelvis        Unchanged PTA meds that are or will be resumed   Medication Sig Dispense Refill   ??? ethinyl estradiol-etonogestrel (NUVARING) 0.12-0.015 mg/24 hr vaginal ring 1 Each by Intravaginally route.       ??? lorazepam (ATIVAN) 0.5 mg tablet Take 0.5 mg by mouth.         New prescriptions   Medication Sig Dispense Refill   ??? levofloxacin (LEVAQUIN) 500 mg tablet Take 1 Tab by mouth daily (before breakfast) for 10 days.  10 Tab  0   ??? mesalamine EC (ASACOL) 400 mg EC tablet Take 2 Tabs by mouth four (4) times daily for 30 days.  240 Tab  0   ??? metronidazole (FLAGYL) 500 mg tablet Take 1 Tab by mouth three (3) times daily (with meals) for 10 days.  30 Tab  0   ??? potassium chloride (K-DUR, KLOR-CON) 20 mEq tablet Take 2 Tabs by mouth two (2) times a day for 3 days.  12 Tab  0           Discharge Exam:  BP 120/80   Pulse 54   Temp 98.7 ??F (37.1 ??C)   Resp 16   Ht 5\' 6"  (1.676 m)   Wt 149 lb 9.6 oz (67.858 kg)   SpO2 97%   LMP 02/17/2008  General appearance: alert, cooperative, no distress, appears stated age  Head: Normocephalic, without obvious abnormality, atraumatic  Lungs: clear to auscultation bilaterally  Heart: regular rate and rhythm, S1, S2 normal, no murmur, click, rub or gallop   Abdomen: soft, non-tender. Bowel sounds normal. No masses,  no organomegaly  Extremities: extremities normal, atraumatic, no cyanosis or edema  Skin: Skin color, texture, turgor normal. No rashes or lesions  Neurologic: Grossly normal    Disposition: home    Patient Instructions:   Current Discharge Medication List      START taking these medications       levofloxacin (LEVAQUIN) 500 mg tablet    Take 1 Tab by mouth daily (before breakfast) for 10 days.    Qty: 10 Tab Refills: 0        mesalamine EC (ASACOL) 400 mg EC tablet    Take 2 Tabs by mouth four (4) times daily for 30 days.    Qty: 240 Tab Refills: 0        metronidazole (FLAGYL) 500 mg tablet    Take 1 Tab by mouth three (3) times daily (with meals) for 10 days.    Qty: 30 Tab Refills: 0        potassium chloride (K-DUR, KLOR-CON) 20 mEq tablet    Take 2 Tabs by mouth two (2) times a day for 3 days.    Qty: 12 Tab  Refills: 0          CONTINUE these medications which have NOT CHANGED       ethinyl estradiol-etonogestrel (NUVARING) 0.12-0.015 mg/24 hr vaginal ring    1 Each by Intravaginally route.        lorazepam (ATIVAN) 0.5 mg tablet    Take 0.5 mg by mouth.              Wound Care: None needed    Signed:  Margit Banda, MD  02/22/2008  12:00 PM

## 2008-02-22 NOTE — Progress Notes (Signed)
Abdominal exam is benign and afebrile . Still on tpn ,no surgical indication as yet . Will see only prn and please call if needed .  Hopefully patient can have disease controlled with oral meds and require no surgery.

## 2008-02-22 NOTE — Progress Notes (Signed)
PICC line discontinued with tip intact.  Antibiotic ointment applied to site with 4x4s and tape.  Pt tolerated well.

## 2008-02-22 NOTE — Progress Notes (Addendum)
GI DAILY PROGRESS NOTE    Admit Date:  02/19/2008    Today's Date:  02/22/2008    CC:  Chron's illeitis.    Subjective:     Patient lying in bed and verbalizes abd pain is gradually improving. C/o some nausea this a.m., but feels like it was just because she didn't have anything on her stomach. Denies vomiting. Last bm during bowel prep. No bm today.    Medications:   Current facility-administered medications   Medication Dose Route Frequency   ??? TPN ADULT - CENTRAL     IntraVENous QPM   ??? levofloxacin (LEVAQUIN) tablet 500 mg  500 mg Oral ACB   ??? metronidazole (FLAGYL) tablet 500 mg  500 mg Oral TID WITH MEALS   ??? DISCONTD: 0.9% sodium chloride infusion    IntraVENous CONTINUOUS   ??? DISCONTD: midazolam (VERSED) injection 0.25-5 mg  0.25-5 mg IntraVENous Multiple   ??? DISCONTD: meperidine (DEMEROL) injection 100 mg  100 mg IntraVENous Multiple   ??? DISCONTD: diphenhydrAMINE (BENADRYL) injection 50 mg  50 mg IntraVENous Multiple   ??? hydrocortisone sodium succinate (SOLUCORTEF) injection 50 mg  50 mg IntraVENous Q8H   ??? sodium chloride (NS) flush 10 mL  10 mL InterCATHeter Q8H   ??? heparin (porcine) pf 300 Units  300 Units InterCATHeter Q8H   ??? sodium chloride (NS) flush 10 mL  10 mL InterCATHeter PRN   ??? heparin (porcine) pf 300 Units  300 Units InterCATHeter PRN   ??? fat emulsion (LIPOSYN) 20 % infusion  21 mL/hr IntraVENous QPM   ??? dextrose 5% - 0.45% NaCl with KCl 20 mEq/L infusion    IntraVENous CONTINUOUS   ??? nicotine (NICODERM CQ) 21 mg/24 hr patch 1 Patch  1 Patch TransDERmal DAILY   ??? DISCONTD: TPN ADULT - CENTRAL     IntraVENous QPM   ??? DISCONTD: HYDROmorphone (DILAUDID) injection 0.5 mg  0.5 mg IntraVENous Q6H PRN   ??? naloxone (NARCAN) injection 0.4 mg  0.4 mg IntraVENous PRN   ??? acetaminophen (TYLENOL) suppository 650 mg  650 mg Rectal Q4H PRN   ??? diphenhydrAMINE (BENADRYL) injection 12.5 mg  12.5 mg IntraVENous Q4H PRN   ??? ondansetron (ZOFRAN) injection 4 mg  4 mg IntraVENous Q4H PRN    ??? promethazine (PHENERGAN) injection 12.5 mg  12.5 mg IntraVENous Q6H PRN   ??? enoxaparin (LOVENOX) injection 40 mg  40 mg SubCUTAneous Q24H   ??? lorazepam (ATIVAN) injection 1 mg  1 mg IntraVENous Q6H PRN   ??? hydrocodone-acetaminophen (LORTAB) 7.5-500 mg per tablet 1 Tab  1 Tab Oral Q4H PRN   ??? mesalamine EC (ASACOL) tablet 800 mg  800 mg Oral QID   ??? DISCONTD: levofloxacin (LEVAQUIN) 750 mg infusion   750 mg IntraVENous Q24H   ??? DISCONTD: metronidazole (FLAGYL) IVPB 500 mg  500 mg IntraVENous Q8H         Review of Systems:  ROS was obtained, with pertinent positives as listed above.    Objective:   Vitals:  BP 120/80   Pulse 54   Temp 98.7 ??F (37.1 ??C)   Resp 16   Ht 5\' 6"  (1.676 m)   Wt 149 lb 9.6 oz (67.858 kg)   SpO2 97%   LMP 02/17/2008  Intake/Output:     In: 5313 (480 P.O. 2159 I.V.)  Out: 5400 (5400 Urine)    Exam:  General appearance: alert, cooperative, no distress  Lungs: clear to auscultation bilaterally anteriorly  Heart: regular rate and rhythm  Abdomen: soft, slightly RLQ tender. Bowel sounds normal. No masses,  no organomegaly  Extremities: extremities normal, atraumatic, no cyanosis or edema  Neuro:  alert and oriented    Data Review (Labs):    Recent Labs   Basename 02/22/08 0409 02/21/08 0400 02/20/08 0405   ??? WBC -- -- 15.7*   ??? HGB -- -- 11.1*   ??? HCT -- -- 34.1*   ??? PLT -- -- 389   ??? MCV -- -- 85.0   ??? NA 140 140 139   ??? K 3.1* 3.1* 3.5   ??? CL 104 105 106   ??? CO2 31 30 23    ??? BUN 7 5* 4*   ??? CREA 0.7 0.7 0.7   ??? CA 7.8* 7.7* 8.0*   ??? GLU 101 116* 128*   ??? AP -- -- --   ??? SGOT -- -- --   ??? GPT -- -- --   ??? TBIL -- -- --   ??? CBIL -- -- --   ??? AML -- -- --   ??? LPSE -- -- --   ??? PTP -- -- --   ??? INR -- -- --   ??? APTT -- -- --         Assessment:     Patient Active Hospital Problem List:   * No active hospital problems. *       Plan:   -s/p colonoscopy 02/21/08-normal  -Cont 10-14 course of po ax, asacol as planned, f/u with gi in 2-3 wks   -decrease in abd pain since admission, pt says  tolerating full liq although little nausea this a.m.-no surgical intervention presently planned    -pt asks when get to go home-instructed to discuss with primary  Crohn's ileitis with clinical improvement. Discussed with pt my med recommendations:  Gave her 2 Rx: Lialda 2 tabs every day. Prednisone taper ie 30 mg every day for 7 days, 20mg  a day for 14 days then 10 mg  Day for 14 days. Dr. Vernell Morgans to write rx for Valtrex. Our office to call her with f/up appt.

## 2008-02-23 LAB — CULTURE, BLOOD
Culture result:: NO GROWTH
Culture result:: NO GROWTH
Report Status: 1162010
Report Status: 1162010

## 2008-02-23 NOTE — Procedures (Signed)
Procedures signed by  at 04/30/08  1004                 Author: Georgiann Hahn A  Service: --  Author Type: Physician       Filed: 04/30/08 1004  Date of Service: 02/23/08 1247  Status: Signed          Editor: Jerene Bears          <!--EPICS-->                           [facility]<BR>                           [address]<BR>                           [place] [address]<BR>                               [phone]<BR> <BR>                              PROCEDURE NOTE<BR> <BR> NAME:  Christine Castillo, Christine Castillo                           MR:  [id number]<WU> LOC:  02  02011             SEX:  F                 ACCT:  [id number]  DOB:  1980-03-05            AGE:  27                PT:  I<BR> ADMIT:  02/19/2008          DSCH:  02/22/2008       MSV:  MED<BR> <BR> DATE OF PROCEDURE:  02/21/2008.<BR> <BR> PROCEDURE:  Colonoscopy.<BR> <BR> PREOPERATIVE DIAGNOSIS:  History of Crohn  ileitis.<BR> <BR> POSTOPERATIVE DIAGNOSIS:  Normal colon exam.<BR> <BR> ANESTHESIA:  Demerol 100 mg IV, Versed 10 mg IV, and Benadryl 50 mg IV.<BR> <BR> INSTRUMENT:  CFQ-180AL<BR> <BR> PROCEDURE NOTE:  In the GI lab, the patient was placed in the left<BR>  lateral decubitus position.  She was given the above-mentioned<BR> medications in increments intravenously for sedation.  The perianal<BR> region was examined and appeared normal.  The Olympus CFQ-180AL video<BR> colonoscope was introduced and advanced  under direct vision into the<BR> cecum which was normal.  The bowel was very well prepped.  The terminal<BR> ileum could not be intubated.  The cecum, ascending colon, transverse<BR> colon, descending, and sigmoid regions were unremarkable.  There was  no<BR> evidence of inflammation or ulceration.  No diverticula, polyps, or<BR> vascular abnormalities were appreciated.  The rectal mucosa was normal,<BR> including retroflexed views.  The scope was withdrawn.  The patient<BR> tolerated the procedure  well.<BR> <BR> <BR>  <BR> <BR> <BR> Jerene Bears, MD<BR> <BR>             This is an unverified document unless signed by physician.<BR> <BR> TID:  sa               DIC ID:  [id number]  DT:  02/23/2008 12:47 P<BR> JOB:  [id number]        DOC#:  [id number]            DD:  02/21/2008<BR> <BR> cc:   Margit Banda, MD<BR>       Jerene Bears, MD<BR>       *[facility]<BR> <!--EPICE-->

## 2008-02-23 NOTE — Procedures (Signed)
[facility]   [address]   [place], [place] [address]   [phone]     PROCEDURE NOTE    NAME: Christine Castillo, Christine Castillo MR: [id number]  LOC: 02 02011 SEX: F ACCT: [id number]  DOB: November 09, 1980 AGE: 28 PT: I  ADMIT: 02/19/2008 DSCH: 02/22/2008 MSV: MED    DATE OF PROCEDURE: 02/21/2008.    PROCEDURE: Colonoscopy.    PREOPERATIVE DIAGNOSIS: History of Crohn ileitis.    POSTOPERATIVE DIAGNOSIS: Normal colon exam.    ANESTHESIA: Demerol 100 mg IV, Versed 10 mg IV, and Benadryl 50 mg IV.    INSTRUMENT: CFQ-180AL    PROCEDURE NOTE: In the GI lab, the patient was placed in the left  lateral decubitus position. She was given the above-mentioned  medications in increments intravenously for sedation. The perianal  region was examined and appeared normal. The Olympus CFQ-180AL video  colonoscope was introduced and advanced under direct vision into the  cecum which was normal. The bowel was very well prepped. The terminal  ileum could not be intubated. The cecum, ascending colon, transverse  colon, descending, and sigmoid regions were unremarkable. There was no  evidence of inflammation or ulceration. No diverticula, polyps, or  vascular abnormalities were appreciated. The rectal mucosa was normal,  including retroflexed views. The scope was withdrawn. The patient  tolerated the procedure well.            Jerene Bears, MD     This is an unverified document unless signed by physician.    TID: sa DIC ID: [id number] DT: 02/23/2008 12:47 P  JOB: [id number] DOC#: [id number] DD: 02/21/2008    cc: Margit Banda, MD   Jerene Bears, MD   *[facility]

## 2008-03-06 ENCOUNTER — Inpatient Hospital Stay
Admit: 2008-03-06 | Discharge: 2008-03-14 | Disposition: A | Discharge status: Home / Self Care | Source: Home / Self Care | Attending: Gastroenterology | Admitting: Gastroenterology

## 2008-03-06 LAB — METABOLIC PANEL, COMPREHENSIVE
A-G Ratio: 0.8 — ABNORMAL LOW (ref 1.2–3.5)
ALT (SGPT): 35 U/L — ABNORMAL LOW (ref 39–65)
AST (SGOT): 12 U/L — ABNORMAL LOW (ref 15–37)
Albumin: 3.3 g/dL — ABNORMAL LOW (ref 3.5–5.0)
Alk. phosphatase: 105 U/L (ref 50–136)
Anion gap: 10 mmol/L (ref 7–16)
BUN: 11 MG/DL (ref 7–18)
Bilirubin, total: 0.3 MG/DL (ref 0.2–1.1)
CO2: 24 MMOL/L (ref 21–32)
Calcium: 8.7 MG/DL (ref 8.4–10.4)
Chloride: 103 MMOL/L (ref 98–107)
Creatinine: 0.9 MG/DL (ref 0.6–1.0)
GFR est AA: >60 mL/min/{1.73_m2} (ref >60)
GFR est non-AA: >60 mL/min/{1.73_m2} (ref >60)
Globulin: 4.1 g/dL — ABNORMAL HIGH (ref 2.3–3.5)
Glucose: 121 MG/DL — ABNORMAL HIGH (ref 74–106)
Potassium: 4.1 MMOL/L (ref 3.5–5.1)
Protein, total: 7.4 g/dL (ref 6.3–8.2)
Sodium: 137 MMOL/L (ref 136–145)

## 2008-03-06 LAB — CBC WITH AUTOMATED DIFF
ABS. BASOPHILS: 0 10*3/uL (ref 0.0–0.2)
ABS. EOSINOPHILS: 0 10*3/uL (ref 0.0–0.8)
ABS. IMM. GRANS.: 0.1 10*3/uL (ref 0.0–2.0)
ABS. LYMPHOCYTES: 2.1 10*3/uL (ref 0.5–4.6)
ABS. MONOCYTES: 0.4 10*3/uL (ref 0.1–1.3)
ABS. NEUTROPHILS: 15.6 10*3/uL — ABNORMAL HIGH (ref 1.7–8.2)
BASOPHILS: 0 % (ref 0.0–2.0)
EOSINOPHILS: 0 % — ABNORMAL LOW (ref 0.5–7.8)
HCT: 39.2 % (ref 37.6–48.3)
HGB: 13 g/dL (ref 11.7–15.0)
LYMPHOCYTES: 11 % — ABNORMAL LOW (ref 13–44)
MCH: 28.3 PG (ref 26.1–32.9)
MCHC: 33.2 g/dL (ref 31.4–35.0)
MCV: 85.4 FL (ref 79.6–97.8)
MONOCYTES: 2 % — ABNORMAL LOW (ref 4.0–12.0)
MPV: 10 FL — ABNORMAL LOW (ref 10.8–14.1)
NEUTROPHILS: 87 % — ABNORMAL HIGH (ref 43–78)
PLATELET: 373 10*3/uL (ref 140–440)
RBC: 4.59 M/uL (ref 3.86–5.18)
RDW: 14.6 % (ref 11.9–14.6)
WBC: 18.3 10*3/uL — ABNORMAL HIGH (ref 4.0–10.5)

## 2008-03-06 MED ADMIN — HYDROmorphone (DILAUDID) injection 1 mg: INTRAVENOUS | @ 23:00:00 | NDC 00641012125

## 2008-03-06 MED ADMIN — ondansetron (ZOFRAN) injection 4 mg: INTRAVENOUS | @ 23:00:00 | NDC 00143989105

## 2008-03-06 MED ADMIN — dextrose 5 % - 0.45% NaCl infusion: INTRAVENOUS | @ 22:00:00 | NDC 00409792609

## 2008-03-06 MED ADMIN — methylPREDNISolone (SOLU-MEDROL) injection 20 mg: INTRAVENOUS | @ 22:00:00 | NDC 00009011312

## 2008-03-06 MED ADMIN — metronidazole (FLAGYL) IVPB 500 mg: INTRAVENOUS | @ 23:00:00 | NDC 00338105548

## 2008-03-06 MED ADMIN — HYDROmorphone (DILAUDID) injection 1 mg: INTRAVENOUS | @ 22:00:00 | NDC 00641012125

## 2008-03-06 MED ADMIN — levofloxacin (LEVAQUIN) 500 mg infusion: INTRAVENOUS | @ 22:00:00 | NDC 00045006801

## 2008-03-06 MED FILL — LEVAQUIN 500 MG/100 ML IN 5% DEXTROSE INTRAVENOUS PIGGYBACK: 500 mg/100 mL | INTRAVENOUS | Qty: 100

## 2008-03-06 MED FILL — HYDROMORPHONE 2 MG/ML INJECTION SOLUTION: 2 mg/mL | INTRAMUSCULAR | Qty: 1

## 2008-03-06 MED FILL — ONDANSETRON (PF) 4 MG/2 ML INJECTION: 4 mg/2 mL | INTRAMUSCULAR | Qty: 2

## 2008-03-06 MED FILL — SOLU-MEDROL 40 MG/ML SOLUTION FOR INJECTION: 40 mg/mL | INTRAMUSCULAR | Qty: 1

## 2008-03-06 MED FILL — METRONIDAZOLE IN SODIUM CHLORIDE (ISO-OSM) 500 MG/100 ML IV PIGGY BACK: 500 mg/100 mL | INTRAVENOUS | Qty: 100

## 2008-03-06 MED FILL — DEXTROSE 5%-1/2 NORMAL SALINE IV: INTRAVENOUS | Qty: 1000

## 2008-03-06 NOTE — Progress Notes (Signed)
Called labs WBC 18.3, Albumin 3.3; ALT 35 & AST12 to Dr. Waynette Buttery and no new orders for labs.

## 2008-03-06 NOTE — Progress Notes (Signed)
I have reviewed the CT scan and there is thickening of the terminal ileum with some stranding in the mesentery but I don't see any collection or abscess that could be drained.  Will follow up official report from radiology.

## 2008-03-06 NOTE — Progress Notes (Signed)
Pt crying and stating pain med not helping and feels like gas. Very unconsolable. Change nurse sent to room.

## 2008-03-06 NOTE — Consults (Signed)
Pt seen and examined.    Tender to palpation in RLQ with voluntary guarding only.    Will review CT scan to see if there is something there that needs to be surgically drained

## 2008-03-06 NOTE — Progress Notes (Signed)
CMS chart review.

## 2008-03-06 NOTE — Progress Notes (Signed)
Pt c/o itching. Benadryl 25 mg IV given.

## 2008-03-06 NOTE — Progress Notes (Addendum)
Paged Dr. Waynette Buttery for benadryl & nicotine patch.

## 2008-03-06 NOTE — Progress Notes (Signed)
Spiritual Care visit. Initial    Prayer with patient.    Visit by Arelia Sneddon, M.Ed., Th.B. ,Staff Chaplain

## 2008-03-06 NOTE — Progress Notes (Signed)
Obtained order for nicotine patch & benadryl IV 25mg  q4-6 for itching.

## 2008-03-06 NOTE — Progress Notes (Signed)
Pt in no distress.

## 2008-03-07 LAB — HCG URINE, QL: HCG urine, QL: NEGATIVE

## 2008-03-07 MED ADMIN — diphenhydrAMINE (BENADRYL) injection 25 mg: INTRAVENOUS | @ 20:00:00 | NDC 00641037621

## 2008-03-07 MED ADMIN — diphenhydrAMINE (BENADRYL) injection 25 mg: INTRAVENOUS | @ 01:00:00 | NDC 00641037621

## 2008-03-07 MED ADMIN — ioversol (OPTIRAY) 350 mg/mL contrast solution 125 mL: INTRAVENOUS | @ 01:00:00 | NDC 00019133361

## 2008-03-07 MED ADMIN — HYDROmorphone (DILAUDID) injection 1 mg: INTRAVENOUS | NDC 00641012125

## 2008-03-07 MED ADMIN — HYDROmorphone (DILAUDID) injection 1 mg: INTRAVENOUS | @ 11:00:00 | NDC 00641012125

## 2008-03-07 MED ADMIN — methylPREDNISolone (SOLU-MEDROL) injection 20 mg: INTRAVENOUS | @ 10:00:00 | NDC 00009011312

## 2008-03-07 MED ADMIN — diatrizoate meglumine & sodium (MD-GASTROVIEW) 66-10 % contrast solution 30 mL: ORAL | @ 01:00:00 | NDC 00019481605

## 2008-03-07 MED ADMIN — dextrose 5 % - 0.45% NaCl infusion: INTRAVENOUS | @ 10:00:00 | NDC 00409792609

## 2008-03-07 MED ADMIN — HYDROmorphone (DILAUDID) injection 1 mg: INTRAVENOUS | @ 20:00:00 | NDC 00641012125

## 2008-03-07 MED ADMIN — lorazepam (ATIVAN) tablet 0.5 mg: ORAL | NDC 63739015410

## 2008-03-07 MED ADMIN — dextrose 5 % - 0.45% NaCl infusion: INTRAVENOUS | @ 22:00:00 | NDC 00409792609

## 2008-03-07 MED ADMIN — HYDROmorphone (DILAUDID) injection 2 mg: INTRAVENOUS | @ 06:00:00 | NDC 00641012125

## 2008-03-07 MED ADMIN — HYDROmorphone (DILAUDID) injection 2 mg: INTRAVENOUS | @ 03:00:00 | NDC 00641012125

## 2008-03-07 MED ADMIN — methylPREDNISolone (SOLU-MEDROL) injection 20 mg: INTRAVENOUS | @ 22:00:00 | NDC 00009011312

## 2008-03-07 MED ADMIN — ondansetron (ZOFRAN) injection 2 mg: INTRAVENOUS | @ 16:00:00 | NDC 00143989105

## 2008-03-07 MED ADMIN — metronidazole (FLAGYL) IVPB 500 mg: INTRAVENOUS | @ 14:00:00 | NDC 00338105548

## 2008-03-07 MED ADMIN — metronidazole (FLAGYL) IVPB 500 mg: INTRAVENOUS | @ 22:00:00 | NDC 00338105548

## 2008-03-07 MED ADMIN — ondansetron (ZOFRAN) injection 4 mg: INTRAVENOUS | @ 06:00:00 | NDC 00143989105

## 2008-03-07 MED ADMIN — nicotine (NICODERM CQ) 14 mg/24 hr patch 1 Patch: TRANSDERMAL | @ 01:00:00 | NDC 00067512509

## 2008-03-07 MED ADMIN — HYDROmorphone (DILAUDID) injection 1 mg: INTRAVENOUS | @ 16:00:00 | NDC 00641012125

## 2008-03-07 MED ADMIN — levofloxacin (LEVAQUIN) 500 mg infusion: INTRAVENOUS | @ 22:00:00 | NDC 00045006801

## 2008-03-07 MED ADMIN — metronidazole (FLAGYL) IVPB 500 mg: INTRAVENOUS | @ 06:00:00 | NDC 00338105548

## 2008-03-07 MED ADMIN — lorazepam (ATIVAN) tablet 0.5 mg: ORAL | @ 16:00:00 | NDC 63739015410

## 2008-03-07 MED ADMIN — diphenhydrAMINE (BENADRYL) injection 25 mg: INTRAVENOUS | @ 06:00:00 | NDC 00641037621

## 2008-03-07 MED FILL — HYDROMORPHONE 2 MG/ML INJECTION SOLUTION: 2 mg/mL | INTRAMUSCULAR | Qty: 1

## 2008-03-07 MED FILL — SOLU-MEDROL 40 MG/ML SOLUTION FOR INJECTION: 40 mg/mL | INTRAMUSCULAR | Qty: 1

## 2008-03-07 MED FILL — LORAZEPAM 0.5 MG TAB: 0.5 mg | ORAL | Qty: 1

## 2008-03-07 MED FILL — ONDANSETRON (PF) 4 MG/2 ML INJECTION: 4 mg/2 mL | INTRAMUSCULAR | Qty: 2

## 2008-03-07 MED FILL — DIPHENHYDRAMINE HCL 50 MG/ML IJ SOLN: 50 mg/mL | INTRAMUSCULAR | Qty: 1

## 2008-03-07 MED FILL — METRONIDAZOLE IN SODIUM CHLORIDE (ISO-OSM) 500 MG/100 ML IV PIGGY BACK: 500 mg/100 mL | INTRAVENOUS | Qty: 100

## 2008-03-07 MED FILL — LEVAQUIN 500 MG/100 ML IN 5% DEXTROSE INTRAVENOUS PIGGYBACK: 500 mg/100 mL | INTRAVENOUS | Qty: 100

## 2008-03-07 MED FILL — NICOTINE 14 MG/24 HR DAILY PATCH: 14 mg/24 hr | TRANSDERMAL | Qty: 1

## 2008-03-07 NOTE — Progress Notes (Signed)
Problem: Interdisciplinary Rounds  Goal: Interdisciplinary Rounds  Interdisciplinary team rounds were held 03/07/2008 with the following team members:Nursing, Outcomes Management, Patient Relations and Pharmacy and the patient.  Plan of Care options were discussed with the team and the patient.  The patient is admitted with abdominal abcess and krohns. She is independent in her care and will return home at discharge

## 2008-03-07 NOTE — Progress Notes (Signed)
Assessed. Pt awake in bed reading book. States her husband is on the way to bring her pizza. States she knows that she is on a clear liquid diet, but still wants the pizza. Encouraged to adhere to ordered diet. States she has been nauseated for 3 years so the pizza will not matter.

## 2008-03-07 NOTE — Progress Notes (Signed)
Call to Dr. Marcell Barlow. Pt request for med for anxiety.

## 2008-03-07 NOTE — Progress Notes (Addendum)
GI DAILY PROGRESS NOTE    Admit Date:  03/06/2008    Today's Date:  03/07/2008    CC:  Crohn's    Subjective:     Some Nausea, no vomiting. RLQ abd pn better, rt a 5 vs an 8 yesterday. No BMs x 1.5 days. Tolerated clears yest.     Medications:   Current facility-administered medications   Medication Dose Route Frequency   ??? dextrose 5 % - 0.45% NaCl infusion    IntraVENous CONTINUOUS   ??? metronidazole (FLAGYL) IVPB 500 mg  500 mg IntraVENous Q8H   ??? methylPREDNISolone (SOLU-MEDROL) injection 20 mg  20 mg IntraVENous Q12H   ??? HYDROmorphone (DILAUDID) injection 1 mg  1 mg IntraVENous Q4H PRN   ??? HYDROmorphone (DILAUDID) injection 2 mg  2 mg IntraVENous Q4H PRN   ??? levofloxacin (LEVAQUIN) 500 mg infusion   500 mg IntraVENous Q24H   ??? ondansetron (ZOFRAN) injection 4 mg  4 mg IntraVENous Q4H PRN   ??? ondansetron (ZOFRAN) injection 2 mg  2 mg IntraVENous Q4H PRN   ??? ioversol (OPTIRAY) 350 mg/mL contrast solution 125 mL  125 mL IntraVENous RAD ONCE   ??? diatrizoate meglumine & sodium (MD-GASTROVIEW) 66-10 % contrast solution 30 mL  30 mL Oral RAD ONCE   ??? diphenhydrAMINE (BENADRYL) injection 25 mg  25 mg IntraVENous Q4H PRN   ??? nicotine (NICODERM CQ) 14 mg/24 hr patch 1 Patch  1 Patch TransDERmal DAILY   ??? DISCONTD: diphenhydrAMINE (BENADRYL) injection 25 mg  25 mg IntraVENous Q6H PRN           Review of Systems:  ROS was obtained, with pertinent positives as listed above.    Objective:   Vitals:  BP 100/60   Pulse 61   Temp 97 ??F (36.1 ??[name])   Resp 20   Ht 5\' 5"  (1.651 m)   Wt 142 lb (64.411 kg)   SpO2 97%   LMP 02/17/2008  Intake/Output:     In: 730 (730 I.V.)  Out: 4 (4 Urine)    Exam:  General appearance: alert, cooperative, no distress  Lungs: clear to auscultation bilaterally anteriorly  Heart: regular rate and rhythm  Abdomen: soft, mildly TTP RLQ. Bowel sounds normal. No masses,  no organomegaly  Extremities: extremities normal, atraumatic, no cyanosis or edema  Neuro:  alert and oriented    Data Review (Labs):     Recent Labs   [facility] 03/06/08 1615   ??? WBC 18.3*   ??? HGB 13.0   ??? HCT 39.2   ??? PLT 373   ??? MCV 85.4   ??? NA 137   ??? K 4.1   ??? CL 103   ??? CO2 24   ??? BUN 11   ??? CREA 0.9   ??? CA 8.7   ??? GLU 121*   ??? AP 105   ??? SGOT 12*   ??? GPT 35*   ??? TBIL 0.3   ??? CBIL --   ??? AML --   ??? LPSE --   ??? PTP --   ??? INR --   ??? APTT --           Assessment:     Crohn's Ileitis.   RLQ abd. Pain.    ABD/PELVIC CT SCAN (1/28/)??PROMINENT WALL THICKENING IN THE TERMINAL ILEUM WITH ASSOCIATED  INFILTRATION IN THE SURROUNDING FAT. ??NO FOCAL ABSCESS.           Anxiety  Plan:   Cont IV solumedrol 20  mg q12  Abx: IV  Levaquin and Flagyl.   Surgery following - appreciate surg assistance.   Add Ativan for anxiety.     Adria Devon PA-Audreana Hancox    Reviewed options w/ pt and she prefers surgery over medical options  Discussed w/ dr. Iona Beard

## 2008-03-07 NOTE — Progress Notes (Signed)
Patient calm, 0 s/s of immediate distress noted, respirations even and unlabored, denies needs pa  in medications given by charge nurse

## 2008-03-07 NOTE — Progress Notes (Signed)
Pt c/o nausea Zofran 4 mg IV given. Pt in no acute distress.

## 2008-03-07 NOTE — Progress Notes (Signed)
Dr. Marcell Barlow returned phone call. Md informed of pt request for anxiety med. Informed this nurse that he will see her on rounds to discuss this matter. Pt informed.

## 2008-03-07 NOTE — Progress Notes (Signed)
Pt c/o itching. Benadryl 25 mg IV given.

## 2008-03-08 MED ADMIN — lorazepam (ATIVAN) tablet 0.5 mg: ORAL | @ 06:00:00 | NDC 63739015410

## 2008-03-08 MED ADMIN — ondansetron (ZOFRAN) injection 2 mg: INTRAVENOUS | @ 17:00:00 | NDC 00143989105

## 2008-03-08 MED ADMIN — nicotine (NICODERM CQ) 14 mg/24 hr patch 1 Patch: TRANSDERMAL | NDC 00067512509

## 2008-03-08 MED ADMIN — methylPREDNISolone (SOLU-MEDROL) injection 20 mg: INTRAVENOUS | @ 10:00:00 | NDC 00009011312

## 2008-03-08 MED ADMIN — methylPREDNISolone (SOLU-MEDROL) injection 20 mg: INTRAVENOUS | @ 23:00:00 | NDC 00009011312

## 2008-03-08 MED ADMIN — HYDROmorphone (DILAUDID) injection 2 mg: INTRAVENOUS | @ 03:00:00 | NDC 00641012125

## 2008-03-08 MED ADMIN — ondansetron (ZOFRAN) injection 4 mg: INTRAVENOUS | @ 03:00:00 | NDC 00143989105

## 2008-03-08 MED ADMIN — metronidazole (FLAGYL) IVPB 500 mg: INTRAVENOUS | @ 06:00:00 | NDC 00338105548

## 2008-03-08 MED ADMIN — HYDROmorphone (DILAUDID) injection 1 mg: INTRAVENOUS | @ 12:00:00 | NDC 00641012125

## 2008-03-08 MED ADMIN — metronidazole (FLAGYL) IVPB 500 mg: INTRAVENOUS | @ 23:00:00 | NDC 00338105548

## 2008-03-08 MED ADMIN — levofloxacin (LEVAQUIN) 500 mg infusion: INTRAVENOUS | @ 23:00:00 | NDC 00045006801

## 2008-03-08 MED ADMIN — diphenhydrAMINE (BENADRYL) injection 25 mg: INTRAVENOUS | @ 12:00:00 | NDC 00641037621

## 2008-03-08 MED ADMIN — lorazepam (ATIVAN) tablet 0.5 mg: ORAL | @ 23:00:00 | NDC 63739015410

## 2008-03-08 MED ADMIN — dextrose 5 % - 0.45% NaCl infusion: INTRAVENOUS | @ 21:00:00 | NDC 00409792609

## 2008-03-08 MED ADMIN — metronidazole (FLAGYL) IVPB 500 mg: INTRAVENOUS | @ 14:00:00 | NDC 00338105548

## 2008-03-08 MED ADMIN — ondansetron (ZOFRAN) injection 4 mg: INTRAVENOUS | @ 08:00:00 | NDC 00143989105

## 2008-03-08 MED ADMIN — HYDROmorphone (DILAUDID) injection 2 mg: INTRAVENOUS | @ 08:00:00 | NDC 00641012125

## 2008-03-08 MED ADMIN — HYDROmorphone (DILAUDID) injection 1 mg: INTRAVENOUS | @ 21:00:00 | NDC 00641012125

## 2008-03-08 MED ADMIN — diphenhydrAMINE (BENADRYL) injection 25 mg: INTRAVENOUS | @ 03:00:00 | NDC 00641037621

## 2008-03-08 MED ADMIN — HYDROmorphone (DILAUDID) injection 1 mg: INTRAVENOUS | @ 17:00:00 | NDC 00641012125

## 2008-03-08 MED ADMIN — dextrose 5 % - 0.45% NaCl infusion: INTRAVENOUS | @ 10:00:00 | NDC 00409792609

## 2008-03-08 MED FILL — METRONIDAZOLE IN SODIUM CHLORIDE (ISO-OSM) 500 MG/100 ML IV PIGGY BACK: 500 mg/100 mL | INTRAVENOUS | Qty: 100

## 2008-03-08 MED FILL — LEVAQUIN 500 MG/100 ML IN 5% DEXTROSE INTRAVENOUS PIGGYBACK: 500 mg/100 mL | INTRAVENOUS | Qty: 100

## 2008-03-08 MED FILL — ONDANSETRON (PF) 4 MG/2 ML INJECTION: 4 mg/2 mL | INTRAMUSCULAR | Qty: 2

## 2008-03-08 MED FILL — HYDROMORPHONE 2 MG/ML INJECTION SOLUTION: 2 mg/mL | INTRAMUSCULAR | Qty: 1

## 2008-03-08 MED FILL — LORAZEPAM 0.5 MG TAB: 0.5 mg | ORAL | Qty: 1

## 2008-03-08 MED FILL — DIPHENHYDRAMINE HCL 50 MG/ML IJ SOLN: 50 mg/mL | INTRAMUSCULAR | Qty: 1

## 2008-03-08 MED FILL — SOLU-MEDROL 40 MG/ML SOLUTION FOR INJECTION: 40 mg/mL | INTRAMUSCULAR | Qty: 1

## 2008-03-08 MED FILL — NICOTINE 14 MG/24 HR DAILY PATCH: 14 mg/24 hr | TRANSDERMAL | Qty: 1

## 2008-03-08 NOTE — Progress Notes (Signed)
Scheduled for Monday by Dr. Iona Beard

## 2008-03-08 NOTE — Progress Notes (Signed)
Given Dilaudid 1mg  IV and Benadryl 25mg  IV for c/o pain and itching. Pt calm and no distress noted.

## 2008-03-08 NOTE — Progress Notes (Signed)
Patient calm, 0 s/s of immediate distress noted, respirations even and unlabored, denies pain or needs.  Pt says anxiety some better.

## 2008-03-08 NOTE — Progress Notes (Signed)
03/08/2008    Admit Date: 03/06/2008    Subjective:   CHIEF COMPLAINT abd pain        Overall-improved           Diet-clear    Appetite-good     Nausea-no   Vomiting-no          Pain-less            BM-yes   Bleeding-no    Medications-reviewed and adjusted as appropriate   IV FLUIDS-reviewed      FAM HX-per H&P   SH-per H&P     Objective:     Blood pressure 111/77, pulse 75, temperature 97.9 ??F (36.6 ??Rudra Hobbins), resp. rate 18, height 5\' 5"  (1.651 m), weight 142 lb (64.411 kg), last menstrual period 02/17/2008, SpO2 97.00%.     In: 480 (480 P.O.)  Out: -      In: 2688 (360 P.O. 2328 I.V.)  Out: 4 (4 Urine)      EXAM:     NEURO-a&o    HEENT-wnl    LUNGS-clear       COR-rrr        ABD-soft , min tenderness, no rebound, good bs        EXT-no edema                Assessment:     Patient Active Hospital Problem List:  Crohn Disease (03/07/2008)    RLQ Abdominal Pain (03/07/2008)      Plan:     Advance po  Surgery on mon  Cont meds

## 2008-03-09 MED ADMIN — dextrose 5 % - 0.45% NaCl infusion: INTRAVENOUS | @ 09:00:00 | NDC 00409792609

## 2008-03-09 MED ADMIN — HYDROmorphone (DILAUDID) injection 1 mg: INTRAVENOUS | @ 14:00:00 | NDC 00641012125

## 2008-03-09 MED ADMIN — levofloxacin (LEVAQUIN) 500 mg infusion: INTRAVENOUS | @ 22:00:00 | NDC 00045006801

## 2008-03-09 MED ADMIN — diphenhydrAMINE (BENADRYL) injection 25 mg: INTRAVENOUS | @ 01:00:00 | NDC 00641037621

## 2008-03-09 MED ADMIN — lorazepam (ATIVAN) tablet 0.5 mg: ORAL | @ 16:00:00 | NDC 63739015410

## 2008-03-09 MED ADMIN — lorazepam (ATIVAN) tablet 0.5 mg: ORAL | @ 05:00:00 | NDC 63739015410

## 2008-03-09 MED ADMIN — HYDROmorphone (DILAUDID) injection 1 mg: INTRAVENOUS | @ 18:00:00 | NDC 54868531900

## 2008-03-09 MED ADMIN — metronidazole (FLAGYL) IVPB 500 mg: INTRAVENOUS | @ 22:00:00 | NDC 00338105548

## 2008-03-09 MED ADMIN — methylPREDNISolone (SOLU-MEDROL) injection 20 mg: INTRAVENOUS | @ 10:00:00 | NDC 00009011312

## 2008-03-09 MED ADMIN — ondansetron (ZOFRAN) injection 2 mg: INTRAVENOUS | @ 18:00:00 | NDC 00143989105

## 2008-03-09 MED ADMIN — ondansetron (ZOFRAN) injection 4 mg: INTRAVENOUS | @ 23:00:00 | NDC 00703722101

## 2008-03-09 MED ADMIN — diphenhydrAMINE (BENADRYL) injection 25 mg: INTRAVENOUS | @ 11:00:00 | NDC 00641037621

## 2008-03-09 MED ADMIN — metronidazole (FLAGYL) IVPB 500 mg: INTRAVENOUS | @ 06:00:00 | NDC 00338105548

## 2008-03-09 MED ADMIN — methylPREDNISolone (SOLU-MEDROL) injection 20 mg: INTRAVENOUS | @ 22:00:00 | NDC 00009011312

## 2008-03-09 MED ADMIN — diphenhydrAMINE (BENADRYL) injection 25 mg: INTRAVENOUS | @ 15:00:00 | NDC 00641037621

## 2008-03-09 MED ADMIN — diphenhydrAMINE (BENADRYL) injection 25 mg: INTRAVENOUS | @ 06:00:00 | NDC 00641037621

## 2008-03-09 MED ADMIN — HYDROmorphone (DILAUDID) injection 1 mg: INTRAVENOUS | @ 09:00:00 | NDC 00641012121

## 2008-03-09 MED ADMIN — HYDROmorphone (DILAUDID) injection 1 mg: INTRAVENOUS | @ 05:00:00 | NDC 00641012121

## 2008-03-09 MED ADMIN — nicotine (NICODERM CQ) 14 mg/24 hr patch 1 Patch: TRANSDERMAL | @ 01:00:00 | NDC 00067512509

## 2008-03-09 MED ADMIN — ondansetron (ZOFRAN) injection 2 mg: INTRAVENOUS | @ 06:00:00 | NDC 00143989105

## 2008-03-09 MED ADMIN — HYDROmorphone (DILAUDID) injection 1 mg: INTRAVENOUS | @ 22:00:00 | NDC 00641012125

## 2008-03-09 MED ADMIN — metronidazole (FLAGYL) IVPB 500 mg: INTRAVENOUS | @ 14:00:00 | NDC 00338105548

## 2008-03-09 MED ADMIN — lorazepam (ATIVAN) tablet 0.5 mg: ORAL | @ 23:00:00 | NDC 63739015410

## 2008-03-09 MED ADMIN — HYDROmorphone (DILAUDID) injection 2 mg: INTRAVENOUS | @ 01:00:00 | NDC 00641012121

## 2008-03-09 MED FILL — HYDROMORPHONE 2 MG/ML INJECTION SOLUTION: 2 mg/mL | INTRAMUSCULAR | Qty: 1

## 2008-03-09 MED FILL — DIPHENHYDRAMINE HCL 50 MG/ML IJ SOLN: 50 mg/mL | INTRAMUSCULAR | Qty: 1

## 2008-03-09 MED FILL — ONDANSETRON (PF) 4 MG/2 ML INJECTION: 4 mg/2 mL | INTRAMUSCULAR | Qty: 2

## 2008-03-09 MED FILL — SOLU-MEDROL 40 MG/ML SOLUTION FOR INJECTION: 40 mg/mL | INTRAMUSCULAR | Qty: 1

## 2008-03-09 MED FILL — LEVAQUIN 500 MG/100 ML IN 5% DEXTROSE INTRAVENOUS PIGGYBACK: 500 mg/100 mL | INTRAVENOUS | Qty: 100

## 2008-03-09 MED FILL — LORAZEPAM 0.5 MG TAB: 0.5 mg | ORAL | Qty: 1

## 2008-03-09 MED FILL — NICOTINE 14 MG/24 HR DAILY PATCH: 14 mg/24 hr | TRANSDERMAL | Qty: 1

## 2008-03-09 MED FILL — METRONIDAZOLE IN SODIUM CHLORIDE (ISO-OSM) 500 MG/100 ML IV PIGGY BACK: 500 mg/100 mL | INTRAVENOUS | Qty: 100

## 2008-03-09 NOTE — Progress Notes (Signed)
Scheduled for tomorrow

## 2008-03-09 NOTE — Progress Notes (Signed)
C/o vaginal irritation & dryness; on abx; instructed to cleanse appropriately w/warm rag; notified oncoming shift

## 2008-03-09 NOTE — Progress Notes (Signed)
Patient calm, 0 s/s of immediate distress noted, respirations even and unlabored, denies pain or needs.  Says pain medication help and will be glad to get surgery over with tomorrow.

## 2008-03-09 NOTE — Progress Notes (Signed)
03/09/2008    Admit Date: 03/06/2008    Subjective:   CHIEF COMPLAINT abd pain        Overall-improved           Diet-reg    Appetite-good     Nausea-chronic   Vomiting-no          Pain-less            BM-yes   Bleeding-no    Medications-reviewed and adjusted as appropriate   IV FLUIDS-reviewed      FAM HX-per H&P   SH-per H&P    Ros- insomnia     Objective:     Blood pressure 99/68, pulse 73, temperature 98.2 ??F (36.8 ??Artemio Dobie), resp. rate 18, height 5\' 5"  (1.651 m), weight 142 lb (64.411 kg), last menstrual period 02/17/2008, SpO2 97.00%.           In: 5808.8 (1840 P.O. 3968.8 I.V.)  Out: -     EXAM:     NEURO-a&o    HEENT-wnl    LUNGS-clear       COR-rrr        ABD-soft , min tenderness, no rebound, good bs        EXT-no edema                Assessment:     Patient Active Hospital Problem List:  Crohn Disease (03/07/2008)    RLQ Abdominal Pain (03/07/2008)      Plan:     Advance po  Surgery on mon  Cont meds    ambien

## 2008-03-09 NOTE — Progress Notes (Incomplete)
C/o of vaginal irritation & dryness; instructed to use warm rags

## 2008-03-10 ENCOUNTER — Inpatient Hospital Stay

## 2008-03-10 MED ADMIN — diphenhydrAMINE (BENADRYL) injection 25 mg: INTRAVENOUS | @ 01:00:00 | NDC 00641037621

## 2008-03-10 MED ADMIN — HYDROmorphone (DILAUDID) injection 1 mg: INTRAVENOUS | @ 23:00:00 | NDC 00409255201

## 2008-03-10 MED ADMIN — HYDROmorphone (DILAUDID) injection 0.5 mg: INTRAVENOUS | @ 17:00:00 | NDC 00641012125

## 2008-03-10 MED ADMIN — promethazine (PHENERGAN) injection 6.25 mg: INTRAVENOUS | @ 18:00:00 | NDC 00641092821

## 2008-03-10 MED ADMIN — HYDROmorphone (DILAUDID) injection 1 mg: INTRAVENOUS | @ 05:00:00 | NDC 00641012121

## 2008-03-10 MED ADMIN — famotidine (PEPCID) tablet 20 mg: ORAL | @ 13:00:00 | NDC 00172572800

## 2008-03-10 MED ADMIN — nicotine (NICODERM CQ) 14 mg/24 hr patch 1 Patch: TRANSDERMAL | @ 02:00:00 | NDC 00067512509

## 2008-03-10 MED ADMIN — diphenhydrAMINE (BENADRYL) injection 25 mg: INTRAVENOUS | @ 11:00:00 | NDC 00641037621

## 2008-03-10 MED ADMIN — methylPREDNISolone (SOLU-MEDROL) injection 20 mg: INTRAVENOUS | @ 23:00:00 | NDC 00009011312

## 2008-03-10 MED ADMIN — diphenhydrAMINE (BENADRYL) injection 25 mg: INTRAVENOUS | @ 06:00:00 | NDC 00641037621

## 2008-03-10 MED ADMIN — metronidazole (FLAGYL) IVPB 500 mg: INTRAVENOUS | @ 13:00:00 | NDC 00338105548

## 2008-03-10 MED ADMIN — zolpidem (AMBIEN) tablet 5 mg: ORAL | @ 05:00:00 | NDC 60505260400

## 2008-03-10 MED ADMIN — metronidazole (FLAGYL) IVPB 500 mg: INTRAVENOUS | @ 22:00:00 | NDC 00409781137

## 2008-03-10 MED ADMIN — diphenhydrAMINE (BENADRYL) injection 25 mg: INTRAVENOUS | @ 22:00:00 | NDC 00641037621

## 2008-03-10 MED ADMIN — HYDROmorphone (DILAUDID) injection 1 mg: INTRAVENOUS | NDC 00409255201

## 2008-03-10 MED ADMIN — HYDROmorphone (DILAUDID) injection 0.5 mg: INTRAVENOUS | @ 18:00:00 | NDC 00641012125

## 2008-03-10 MED ADMIN — lactated ringers infusion: INTRAVENOUS | @ 20:00:00 | NDC 00409795309

## 2008-03-10 MED ADMIN — HYDROmorphone (DILAUDID) injection 1 mg: INTRAVENOUS | @ 20:00:00 | NDC 00641012125

## 2008-03-10 MED ADMIN — lactated ringers infusion: INTRAVENOUS | @ 13:00:00 | NDC 00409795309

## 2008-03-10 MED ADMIN — metronidazole (FLAGYL) IVPB 500 mg: INTRAVENOUS | @ 06:00:00 | NDC 00338105548

## 2008-03-10 MED ADMIN — HYDROmorphone (DILAUDID) injection 2 mg: INTRAVENOUS | @ 11:00:00 | NDC 00641012121

## 2008-03-10 MED ADMIN — dextrose 5 % - 0.45% NaCl infusion: INTRAVENOUS | @ 11:00:00 | NDC 00409792609

## 2008-03-10 MED ADMIN — methylPREDNISolone (SOLU-MEDROL) injection 20 mg: INTRAVENOUS | @ 10:00:00 | NDC 00009011312

## 2008-03-10 MED ADMIN — HYDROmorphone (DILAUDID) injection 2 mg: INTRAVENOUS | @ 01:00:00 | NDC 00641012121

## 2008-03-10 MED ADMIN — midazolam (VERSED) injection 2 mg: INTRAVENOUS | @ 14:00:00 | NDC 10019002802

## 2008-03-10 MED ADMIN — lorazepam (ATIVAN) tablet 0.5 mg: ORAL | @ 04:00:00 | NDC 63739015410

## 2008-03-10 MED ADMIN — levofloxacin (LEVAQUIN) 500 mg infusion: INTRAVENOUS | @ 23:00:00 | NDC 00045006801

## 2008-03-10 MED FILL — METRONIDAZOLE IN SODIUM CHLORIDE (ISO-OSM) 500 MG/100 ML IV PIGGY BACK: 500 mg/100 mL | INTRAVENOUS | Qty: 100

## 2008-03-10 MED FILL — DIPHENHYDRAMINE HCL 50 MG/ML IJ SOLN: 50 mg/mL | INTRAMUSCULAR | Qty: 1

## 2008-03-10 MED FILL — BUPIVACAINE (PF) 0.5 % (5 MG/ML) IJ SOLN: 0.5 % (5 mg/mL) | INTRAMUSCULAR | Qty: 30

## 2008-03-10 MED FILL — HYDROMORPHONE 2 MG/ML INJECTION SOLUTION: 2 mg/mL | INTRAMUSCULAR | Qty: 1

## 2008-03-10 MED FILL — FAMOTIDINE 20 MG TAB: 20 mg | ORAL | Qty: 1

## 2008-03-10 MED FILL — SOLU-MEDROL 40 MG/ML SOLUTION FOR INJECTION: 40 mg/mL | INTRAMUSCULAR | Qty: 1

## 2008-03-10 MED FILL — LACTATED RINGERS IV: INTRAVENOUS | Qty: 1000

## 2008-03-10 MED FILL — HYDROMORPHONE (PF) 1 MG/ML IJ SOLN: 1 mg/mL | INTRAMUSCULAR | Qty: 1

## 2008-03-10 MED FILL — PROMETHAZINE 25 MG/ML INJECTION: 25 mg/mL | INTRAMUSCULAR | Qty: 1

## 2008-03-10 MED FILL — ZOLPIDEM 5 MG TAB: 5 mg | ORAL | Qty: 1

## 2008-03-10 MED FILL — NICOTINE 14 MG/24 HR DAILY PATCH: 14 mg/24 hr | TRANSDERMAL | Qty: 1

## 2008-03-10 MED FILL — SORE THROAT SPRAY: Qty: 177

## 2008-03-10 MED FILL — LORAZEPAM 0.5 MG TAB: 0.5 mg | ORAL | Qty: 1

## 2008-03-10 MED FILL — LEVAQUIN 500 MG/100 ML IN 5% DEXTROSE INTRAVENOUS PIGGYBACK: 500 mg/100 mL | INTRAVENOUS | Qty: 100

## 2008-03-10 NOTE — Op Note (Signed)
Dictated

## 2008-03-10 NOTE — Other (Signed)
Pt wakes crying and needs reassurance - calms easily and dozes back off to sleep.

## 2008-03-10 NOTE — Brief Op Note (Signed)
BRIEF OP NOTE  Pre-Op Diagnosis: Intractable CROHNS DISEASE  Post-Op Diagnosis: same   Procedure:  Laparoscopic Ileo-cecectomy  Surgeon: Adele Schilder, MD  Assistant(s):Dr Loreta Ave    Anesthesia: General   Estimated Blood Loss:  20ml  Specimens:   ID Type Source Tests Collected by Time Destination   1 : Terminal ileum,, cecum Fresh Appendix  Goddess Gebbia R. 03/10/2008 1114 Pathology      Findings: See full operative note.  Complications: none  Implants: * No implants in log *

## 2008-03-10 NOTE — Progress Notes (Signed)
TRANSFER - OUT REPORT:    Verbal report given to [name] on [employer]  being transferred to [employer] routine progression of care       Report consisted of patient???s Situation, Background, Assessment and   Recommendations(SBAR).     Information from the following report(s) Kardex was reviewed with the receiving nurse.    Opportunity for questions and clarification was provided.

## 2008-03-10 NOTE — Other (Signed)
Family updated on pt status

## 2008-03-10 NOTE — Other (Signed)
TRANSFER - OUT REPORT:    Verbal report given to Danne Baxter, RN on [employer]  being transferred to 2nd floor for routine post - op       Report consisted of patient???s Situation, Background, Assessment and   Recommendations(SBAR).     Information from the following report(s) Kardex, OR Summary and MAR was reviewed with the receiving nurse.    Opportunity for questions and clarification was provided.

## 2008-03-10 NOTE — Progress Notes (Signed)
GI DAILY PROGRESS NOTE    Admit Date: 03/06/2008    CC: concerned about surg    Subjective:     Patient Briefly saw pt on her way down to surg. Anxious. No pain. Last BM 3 days ago. NPO.    Objective:   Vitals:  BP 138/96   Pulse 98   Temp 98.4 ??F (36.9 ??Brownie Nehme)   Resp 22   Ht 5\' 5"  (1.651 m)   Wt 142 lb (64.411 kg)   SpO2 98%   LMP 02/17/2008      Exam:  General appearance: alert, cooperative, no distress  Lungs: clear to auscultation bilaterally  Heart: regular rate and rhythm  Abdomen: soft, non-tender. Bowel sounds normal. No masses,  no organomegaly    Data Review (Labs):      No results found for this basename: WBC:*,HGB:*,MCV:*,PLT:*,NA:*,K:*,CREA:*,BUN:*,ALB:*,TBIL:*,SGOT:*,GPT:*,AP:*,AML:*,LPSE:*,INR:* in the last 72 hours    Assessment:   Crohn Disease (03/07/2008)    RLQ Abdominal Pain (03/07/2008)           Plan:   Surgery today  Cont meds??         Currie Paris PA-Bama Hanselman

## 2008-03-10 NOTE — Other (Signed)
Family updated at 10:54 AM by Clide Dales, RN.

## 2008-03-10 NOTE — Progress Notes (Signed)
Report called to [name], pepcid po given and IV fluids hung. Assessment complete pt rates pain to abd 3/10. Pt by stretcher to pre-op.

## 2008-03-10 NOTE — Other (Signed)
TRANSFER - IN REPORT:    Verbal report received from [place], RN on [employer]  being received from 6th floor  for routine progression of care      Report consisted of patient???s Situation, Background, Assessment and   Recommendations(SBAR).     Information from the following report(s) Kardex, ED Summary, OR Summary, Procedure Summary, Intake/Output, MAR, Accordion and Recent Results was reviewed with the receiving nurse.    Opportunity for questions and clarification was provided.      Assessment completed upon patient???s arrival to unit and care assumed.     Patient is teary crying and upset.  Dr. Sallee Lange is notified of this.  IV patent to right forearm. VSS.  Dr. Sallee Lange here to see patient.

## 2008-03-10 NOTE — Progress Notes (Signed)
TRANSFER - IN REPORT:    Verbal report received from Tomasa Hosteller, RN (name) on [employer]  being received from PACU (unit) for routine post - op      Report consisted of patient???s Situation, Background, Assessment and   Recommendations(SBAR).     Information from the following report(s) Kardex, Intake/Output, MAR and Recent Results was reviewed with the receiving nurse.    Opportunity for questions and clarification was provided.      Assessment completed upon patient???s arrival to unit and care assumed.

## 2008-03-10 NOTE — Other (Signed)
Attempt to call report - RN unavailable

## 2008-03-10 NOTE — Other (Signed)
Versed 2 mg given slow IVP. Patient now rests with eyes closed and crying has ceased for the present.  Dr. Sallee Lange stated that Versed IVP 2mg  could be repeated if needed.

## 2008-03-10 NOTE — Progress Notes (Signed)
Vital signs stable, pain well controlled, Alert and oriented, follow up per surgeon, no anesthetic complications, Transfer to floor.

## 2008-03-11 MED ADMIN — dextrose 5 % - 0.45% NaCl infusion: INTRAVENOUS | @ 21:00:00 | NDC 00409792609

## 2008-03-11 MED ADMIN — diphenhydrAMINE (BENADRYL) injection 25 mg: INTRAVENOUS | @ 04:00:00 | NDC 00641037621

## 2008-03-11 MED ADMIN — nicotine (NICODERM CQ) 14 mg/24 hr patch 1 Patch: TRANSDERMAL | @ 01:00:00 | NDC 00067512509

## 2008-03-11 MED ADMIN — dextrose 5 % - 0.45% NaCl infusion: INTRAVENOUS | @ 05:00:00 | NDC 00409792609

## 2008-03-11 MED ADMIN — diphenhydrAMINE (BENADRYL) injection 25 mg: INTRAVENOUS | @ 08:00:00 | NDC 00641037621

## 2008-03-11 MED ADMIN — HYDROmorphone (DILAUDID) injection 2 mg: INTRAVENOUS | @ 17:00:00 | NDC 00409255201

## 2008-03-11 MED ADMIN — metronidazole (FLAGYL) IVPB 500 mg: INTRAVENOUS | @ 21:00:00 | NDC 00338105548

## 2008-03-11 MED ADMIN — oxycodone-acetaminophen (PERCOCET 10) 10-325 mg per tablet 1 Tab: ORAL | @ 23:00:00 | NDC 00406052362

## 2008-03-11 MED ADMIN — methylPREDNISolone (SOLU-MEDROL) injection 20 mg: INTRAVENOUS | @ 10:00:00 | NDC 00009011312

## 2008-03-11 MED ADMIN — HYDROmorphone (DILAUDID) injection 2 mg: INTRAVENOUS | @ 13:00:00 | NDC 00409255201

## 2008-03-11 MED ADMIN — diphenhydrAMINE (BENADRYL) injection 25 mg: INTRAVENOUS | @ 18:00:00 | NDC 00641037621

## 2008-03-11 MED ADMIN — levofloxacin (LEVAQUIN) 500 mg infusion: INTRAVENOUS | @ 21:00:00 | NDC 00045006801

## 2008-03-11 MED ADMIN — metronidazole (FLAGYL) IVPB 500 mg: INTRAVENOUS | @ 13:00:00 | NDC 00338105548

## 2008-03-11 MED ADMIN — lorazepam (ATIVAN) tablet 0.5 mg: ORAL | @ 01:00:00 | NDC 63739015410

## 2008-03-11 MED ADMIN — HYDROmorphone (DILAUDID) injection 2 mg: INTRAVENOUS | @ 03:00:00 | NDC 00409255201

## 2008-03-11 MED ADMIN — HYDROmorphone (DILAUDID) injection 2 mg: INTRAVENOUS | @ 08:00:00 | NDC 00409255201

## 2008-03-11 MED ADMIN — metronidazole (FLAGYL) IVPB 500 mg: INTRAVENOUS | @ 05:00:00 | NDC 00338105548

## 2008-03-11 MED ADMIN — oxycodone-acetaminophen (PERCOCET 10) 10-325 mg per tablet 1 Tab: ORAL | @ 20:00:00 | NDC 00406052362

## 2008-03-11 MED ADMIN — metronidazole (FLAGYL) IVPB 500 mg: INTRAVENOUS | @ 14:00:00 | NDC 80830246509

## 2008-03-11 MED FILL — OXYCODONE-ACETAMINOPHEN 10 MG-325 MG TAB: 10-325 mg | ORAL | Qty: 1

## 2008-03-11 MED FILL — METRONIDAZOLE IN SODIUM CHLORIDE (ISO-OSM) 500 MG/100 ML IV PIGGY BACK: 500 mg/100 mL | INTRAVENOUS | Qty: 100

## 2008-03-11 MED FILL — DIPHENHYDRAMINE HCL 50 MG/ML IJ SOLN: 50 mg/mL | INTRAMUSCULAR | Qty: 1

## 2008-03-11 MED FILL — NICOTINE 14 MG/24 HR DAILY PATCH: 14 mg/24 hr | TRANSDERMAL | Qty: 1

## 2008-03-11 MED FILL — HYDROMORPHONE (PF) 1 MG/ML IJ SOLN: 1 mg/mL | INTRAMUSCULAR | Qty: 2

## 2008-03-11 MED FILL — SOLU-MEDROL 40 MG/ML SOLUTION FOR INJECTION: 40 mg/mL | INTRAMUSCULAR | Qty: 1

## 2008-03-11 MED FILL — LORAZEPAM 0.5 MG TAB: 0.5 mg | ORAL | Qty: 1

## 2008-03-11 MED FILL — LEVAQUIN 500 MG/100 ML IN 5% DEXTROSE INTRAVENOUS PIGGYBACK: 500 mg/100 mL | INTRAVENOUS | Qty: 100

## 2008-03-11 NOTE — Progress Notes (Signed)
No events overnight.  C/o pain with only partial relief with IV pain meds  AF VSS  Incisions clean and dry   Labs stable.     Plan;  Advance to clears   OOB today and ambulate  Restart PO pain meds

## 2008-03-11 NOTE — Progress Notes (Signed)
Patient seen by anesthesia. Patient reports 9/10 pain after surgery and currently 9/10 pain. Patient states pain is not adequately controlled. Patient states, "pain tends to get out of control and pain medication is not given at appropriate times." No reports of N/V after surgery.

## 2008-03-11 NOTE — Progress Notes (Signed)
Patient assessment completed as charted, family at bedside, no needs stated and call bell in reach

## 2008-03-11 NOTE — Progress Notes (Addendum)
GI DAILY PROGRESS NOTE    Admit Date: 03/06/2008    CC: Crohn's    Subjective:   S/p lap ileocolectom 2/1.  Tearful. C/o of diffuse abd pain. Some nausea, no vomiting. + flatus. No BMs.     Objective:   Vitals:  BP 107/72   Pulse 83   Temp 98.5 ??F (36.9 ??C)   Resp 18   Ht 5\' 5"  (1.651 m)   Wt 142 lb (64.411 kg)   SpO2 94%   LMP 02/17/2008      Exam:  General appearance: alert, cooperative, no distress  Lungs: clear to auscultation bilaterally  Heart: regular rate and rhythm  Abdomen: soft, TTP diffusely.  Hypoactive BS.  Dry dressing intact.     Data Review (Labs):      No results found for this basename: WBC:*,HGB:*,MCV:*,PLT:*,NA:*,K:*,CREA:*,BUN:*,ALB:*,TBIL:*,SGOT:*,GPT:*,AP:*,AML:*,LPSE:*,INR:* in the last 72 hours    Assessment:   Crohn Disease (03/07/2008) s/p lap ileocolectomy     RLQ Abdominal Pain (03/07/2008)           Plan:   Pain mgt.  Convert to po steroids at some pt.       Eric Carandang PA-C     I have seen and examined the patient, and I have directed and agreed to the plan as described.  Post-op care.  Transition to oral steroids with plan to taper.  Bari Edward, MD

## 2008-03-12 MED ADMIN — HYDROmorphone (DILAUDID) injection 1 mg: INTRAVENOUS | @ 02:00:00 | NDC 00409255201

## 2008-03-12 MED ADMIN — dextrose 5 % - 0.45% NaCl infusion: INTRAVENOUS | @ 15:00:00 | NDC 00409792609

## 2008-03-12 MED ADMIN — oxycodone-acetaminophen (PERCOCET 10) 10-325 mg per tablet 1 Tab: ORAL | @ 10:00:00 | NDC 00406052362

## 2008-03-12 MED ADMIN — ondansetron (ZOFRAN) injection 4 mg: INTRAVENOUS | @ 18:00:00 | NDC 00143989105

## 2008-03-12 MED ADMIN — predniSONE (DELTASONE) tablet 15 mg: ORAL | @ 18:00:00 | NDC 00182020100

## 2008-03-12 MED ADMIN — ondansetron (ZOFRAN) injection 4 mg: INTRAVENOUS | @ 22:00:00 | NDC 00143989105

## 2008-03-12 MED ADMIN — lorazepam (ATIVAN) tablet 0.5 mg: ORAL | @ 15:00:00 | NDC 63739015410

## 2008-03-12 MED ADMIN — HYDROmorphone (DILAUDID) injection 1 mg: INTRAVENOUS | @ 20:00:00 | NDC 00409255201

## 2008-03-12 MED ADMIN — oxycodone-acetaminophen (PERCOCET 10) 10-325 mg per tablet 1 Tab: ORAL | @ 22:00:00 | NDC 00406052362

## 2008-03-12 MED ADMIN — ondansetron (ZOFRAN) injection 4 mg: INTRAVENOUS | @ 13:00:00 | NDC 00143989105

## 2008-03-12 MED ADMIN — lorazepam (ATIVAN) tablet 0.5 mg: ORAL | @ 06:00:00 | NDC 63739015410

## 2008-03-12 MED ADMIN — metronidazole (FLAGYL) IVPB 500 mg: INTRAVENOUS | @ 06:00:00 | NDC 00338105548

## 2008-03-12 MED ADMIN — oxycodone-acetaminophen (PERCOCET 10) 10-325 mg per tablet 1 Tab: ORAL | @ 04:00:00 | NDC 00406052362

## 2008-03-12 MED ADMIN — HYDROmorphone (DILAUDID) injection 1 mg: INTRAVENOUS | @ 13:00:00 | NDC 00409255201

## 2008-03-12 MED ADMIN — zolpidem (AMBIEN) tablet 5 mg: ORAL | @ 04:00:00 | NDC 68084022511

## 2008-03-12 MED ADMIN — metronidazole (FLAGYL) IVPB 500 mg: INTRAVENOUS | @ 15:00:00 | NDC 00338105548

## 2008-03-12 MED ADMIN — metronidazole (FLAGYL) IVPB 500 mg: INTRAVENOUS | @ 22:00:00 | NDC 00338105548

## 2008-03-12 MED ADMIN — nicotine (NICODERM CQ) 14 mg/24 hr patch 1 Patch: TRANSDERMAL | @ 01:00:00 | NDC 00067512509

## 2008-03-12 MED ADMIN — HYDROmorphone (DILAUDID) injection 1 mg: INTRAVENOUS | @ 07:00:00 | NDC 00409255201

## 2008-03-12 MED FILL — HYDROMORPHONE (PF) 1 MG/ML IJ SOLN: 1 mg/mL | INTRAMUSCULAR | Qty: 1

## 2008-03-12 MED FILL — OXYCODONE-ACETAMINOPHEN 10 MG-325 MG TAB: 10-325 mg | ORAL | Qty: 1

## 2008-03-12 MED FILL — ONDANSETRON (PF) 4 MG/2 ML INJECTION: 4 mg/2 mL | INTRAMUSCULAR | Qty: 2

## 2008-03-12 MED FILL — METRONIDAZOLE IN SODIUM CHLORIDE (ISO-OSM) 500 MG/100 ML IV PIGGY BACK: 500 mg/100 mL | INTRAVENOUS | Qty: 100

## 2008-03-12 MED FILL — PREDNISONE 10 MG TAB: 10 mg | ORAL | Qty: 1

## 2008-03-12 MED FILL — LORAZEPAM 0.5 MG TAB: 0.5 mg | ORAL | Qty: 1

## 2008-03-12 MED FILL — ZOLPIDEM 5 MG TAB: 5 mg | ORAL | Qty: 1

## 2008-03-12 MED FILL — NICOTINE 14 MG/24 HR DAILY PATCH: 14 mg/24 hr | TRANSDERMAL | Qty: 1

## 2008-03-12 NOTE — Progress Notes (Addendum)
 Pt. Last Name: [facility] Pt. First Name: [address] [facility] MR#: [id number] / Admit#: [id number]   [place], [place] [address] DOB: 1980-10-28 / Age: 28  Attn.: Reeves Forth  Location: [phone] [id number]        Case Management - Progress Note  Initial Open Date: 03/07/2008   Case Manager: Erlene Quan, LMSW    Initial Open Date: 03/07/2008  Social Worker: Erlene Quan LMSW    Expected Date of Discharge: 03/13/2008  Transferred From: Home  ECF Bed Held Until:   Bed Held By:     Power of Attorney:   POA/Guardian/Conservator Capacity:   Primary Caregiver:   Living Arrangements: Own Home    Source of Income: Public Assistance  Payee:   Psychosocial History:   Cultural/Religious/Language Issues:   Education Level:   ADLS/Current Living Arrangements Issues: Pt is currently living at home with   her husband and children and plans to return home at d/c. Pt is typically   independent in her ADLs.    Past Providers:     Will patient perform self care at discharge? Y    Anticipated Discharge Disposition Goal: Return to admission address    Assessment/Plan:   03/12/2008 14:12POD #2. Starting clears. Encourage oob and ambulation. Patient   with feeling like her pain is not well controlled. Will follow. Delsa Sale   Ralph Leyden, LISW    03/07/2008 10:37 R. Wentzell. SW met with pt. Pt is 28 yof here due to   crohn's flare. Pt was alert and oriented x3. Pt denies d/c needs at this   time. SW will follow and assist as d/c needs arise.       Resources at Discharge:           Service Providers at Discharge:

## 2008-03-12 NOTE — Progress Notes (Signed)
GI DAILY PROGRESS NOTE    Admit Date: 03/06/2008    CC: Crohn's disease    Subjective:   Patient still has abdominal pain and vomitted this morning.  Very little flatus.    Objective:   Vitals: Blood pressure 101/66, pulse 110, temperature 98.6 ??F (37 ??C), resp. rate 18, height 5\' 5"  (1.651 m), weight 142 lb (64.411 kg), last menstrual period 02/17/2008, SpO2 95.00%.  Intake / Output: ; In: 3611 (960 P.O. 2651 I.V.)  Out: 5050 (5050 Urine)    Meds:  @[facility] @  Exam:  General appearance: alert, cooperative, no distress  Lungs: clear to auscultation bilaterally  Heart: regular rate and rhythm  Abdomen: soft, tender. Bowel sounds hypoactive. No masses,  no organomegaly  Extremities: extremities normal, atraumatic, no cyanosis or edema    Data Review (Labs): Reviewed.    Assessment:     Patient Active Hospital Problem List:  Crohn Disease (03/07/2008) S/p resection of the inflammed ileal segment; pathology consistent with Crohn's.  Tapering steroids.    RLQ Abdominal Pain (03/07/2008)    Plan:  Post-op care  Taper steroids over a few weeks.    Bari Edward, MD

## 2008-03-12 NOTE — Progress Notes (Signed)
Very low pain tolerance.   abd soft, nondistended  Incisions clean  AF VSS    Plan  OOB and ambulate today  PO meds  Advance diet with return of bowel function

## 2008-03-12 NOTE — Progress Notes (Signed)
Problem: Nutrition Deficit  Goal: *Optimize nutritional status  Assessment per standard of care.Poor po identified per nursing admission nutrition assessment.  Problem:   ?? Inadequate kcal and protein intake r/t alteration in GI function as evidenced by Crohn's ileitis,S/P ileo-cecectomy(2-1) N/V today,current clear liquid diet is nutritionally inadequate.NPO/CLQ diet status for 4 out of 6 days since admission.Pt was on TPN for 2 days last admission in January.She report she was eating <50% of her usual initially after she went home but got to the point where she was eating more than her usual because the steroids increased her appetite.Her appetite declined again last Wednesday(1 week ago)   ?? Alb 3.3(1-28) potentially c/w  mild visceral protein depletion vs non-nutritional.Expect this has decreased   ?? Ht 5'5"-5'6",Stated admission wt 142#,BMI 23.7-acceptable. Wt hx: 149-151#(1-13,1-15).Stated UBW 135-145#. 94% recent UBW c/w mild somatic protein depletion,6 % change in wt over 2 weeks c/w severe change.   Goal:   ?? Patient will tolerate >50% or >full liquid diet within 3-5 days   ?? Estimated needs: 1615-1935 kcal /day(25-30 kcal/kg ABW); 70-89 grams Protein/day(1.2-1.5 grams/kg IBW), Max CHO:327 grams/day( 4mg /kg IBW/min), Fluid: 14ml/kcal   Intervention:   1. F/U for diet progression,tolerance and intake.   2. If unable to tolerate p.o. diet >50% of full liquid diet or > within 3-5 days,consider TPN.   3. Consider check BMP.   Nikola Blackston,RD,LD,CNSD [phone]

## 2008-03-13 LAB — METABOLIC PANEL, COMPREHENSIVE
A-G Ratio: 0.7 — ABNORMAL LOW (ref 1.2–3.5)
ALT (SGPT): 25 U/L — ABNORMAL LOW (ref 39–65)
AST (SGOT): 11 U/L — ABNORMAL LOW (ref 15–37)
Albumin: 2.5 g/dL — ABNORMAL LOW (ref 3.5–5.0)
Alk. phosphatase: 95 U/L (ref 50–136)
Anion gap: 7 mmol/L (ref 7–16)
BUN: 8 MG/DL (ref 7–18)
Bilirubin, total: 0.6 MG/DL (ref 0.2–1.1)
CO2: 27 MMOL/L (ref 21–32)
Calcium: 8.1 MG/DL — ABNORMAL LOW (ref 8.4–10.4)
Chloride: 100 MMOL/L (ref 98–107)
Creatinine: 0.8 MG/DL (ref 0.6–1.0)
GFR est AA: >60 mL/min/{1.73_m2} (ref >60)
GFR est non-AA: >60 mL/min/{1.73_m2} (ref >60)
Globulin: 3.6 g/dL — ABNORMAL HIGH (ref 2.3–3.5)
Glucose: 102 MG/DL (ref 74–106)
Potassium: 3.5 MMOL/L (ref 3.5–5.1)
Protein, total: 6.1 g/dL — ABNORMAL LOW (ref 6.3–8.2)
Sodium: 134 MMOL/L — ABNORMAL LOW (ref 136–145)

## 2008-03-13 LAB — CBC W/O DIFF
HCT: 37.6 % (ref 37.6–48.3)
HGB: 12.3 g/dL (ref 11.7–15.0)
MCH: 28.3 PG (ref 26.1–32.9)
MCHC: 32.7 g/dL (ref 31.4–35.0)
MCV: 86.6 FL (ref 79.6–97.8)
MPV: 9.4 FL — ABNORMAL LOW (ref 10.8–14.1)
PLATELET: 298 10*3/uL (ref 140–440)
RBC: 4.34 M/uL (ref 3.86–5.18)
RDW: 13.8 % (ref 11.9–14.6)
WBC: 16.2 10*3/uL — ABNORMAL HIGH (ref 4.0–10.5)

## 2008-03-13 MED ADMIN — oxycodone-acetaminophen (PERCOCET 10) 10-325 mg per tablet 1 Tab: ORAL | @ 12:00:00 | NDC 00406052362

## 2008-03-13 MED ADMIN — zolpidem (AMBIEN) tablet 5 mg: ORAL | @ 04:00:00 | NDC 68084022511

## 2008-03-13 MED ADMIN — oxycodone-acetaminophen (PERCOCET 10) 10-325 mg per tablet 1 Tab: ORAL | @ 22:00:00 | NDC 00406052362

## 2008-03-13 MED ADMIN — lorazepam (ATIVAN) tablet 0.5 mg: ORAL | NDC 63739015410

## 2008-03-13 MED ADMIN — diphenhydrAMINE (BENADRYL) injection 25 mg: INTRAVENOUS | @ 06:00:00 | NDC 00641037621

## 2008-03-13 MED ADMIN — dextrose 5 % - 0.45% NaCl infusion: INTRAVENOUS | @ 05:00:00 | NDC 00409792609

## 2008-03-13 MED ADMIN — nicotine (NICODERM CQ) 14 mg/24 hr patch 1 Patch: TRANSDERMAL | NDC 00067512509

## 2008-03-13 MED ADMIN — oxycodone-acetaminophen (PERCOCET 10) 10-325 mg per tablet 1 Tab: ORAL | @ 03:00:00 | NDC 00406052362

## 2008-03-13 MED ADMIN — HYDROmorphone (DILAUDID) injection 1 mg: INTRAVENOUS | @ 05:00:00 | NDC 00409255201

## 2008-03-13 MED ADMIN — ondansetron (ZOFRAN) injection 4 mg: INTRAVENOUS | @ 13:00:00 | NDC 00143989105

## 2008-03-13 MED ADMIN — levofloxacin (LEVAQUIN) 500 mg infusion: INTRAVENOUS | NDC 00045006801

## 2008-03-13 MED ADMIN — metronidazole (FLAGYL) IVPB 500 mg: INTRAVENOUS | @ 06:00:00 | NDC 00338105548

## 2008-03-13 MED ADMIN — ondansetron (ZOFRAN) injection 4 mg: INTRAVENOUS | @ 03:00:00 | NDC 00143989105

## 2008-03-13 MED ADMIN — metronidazole (FLAGYL) IVPB 500 mg: INTRAVENOUS | @ 13:00:00 | NDC 00338105548

## 2008-03-13 MED ADMIN — ondansetron (ZOFRAN) injection 4 mg: INTRAVENOUS | @ 18:00:00 | NDC 00143989105

## 2008-03-13 MED ADMIN — HYDROmorphone (DILAUDID) injection 1 mg: INTRAVENOUS | @ 16:00:00 | NDC 00409255201

## 2008-03-13 MED FILL — PREDNISONE 10 MG TAB: 10 mg | ORAL | Qty: 1

## 2008-03-13 MED FILL — ONDANSETRON (PF) 4 MG/2 ML INJECTION: 4 mg/2 mL | INTRAMUSCULAR | Qty: 2

## 2008-03-13 MED FILL — PROMETHEGAN 25 MG RECTAL SUPPOSITORY: 25 mg | RECTAL | Qty: 1

## 2008-03-13 MED FILL — DIPHENHYDRAMINE HCL 50 MG/ML IJ SOLN: 50 mg/mL | INTRAMUSCULAR | Qty: 1

## 2008-03-13 MED FILL — HYDROMORPHONE (PF) 1 MG/ML IJ SOLN: 1 mg/mL | INTRAMUSCULAR | Qty: 1

## 2008-03-13 MED FILL — LORAZEPAM 0.5 MG TAB: 0.5 mg | ORAL | Qty: 1

## 2008-03-13 MED FILL — METRONIDAZOLE IN SODIUM CHLORIDE (ISO-OSM) 500 MG/100 ML IV PIGGY BACK: 500 mg/100 mL | INTRAVENOUS | Qty: 100

## 2008-03-13 MED FILL — OXYCODONE-ACETAMINOPHEN 10 MG-325 MG TAB: 10-325 mg | ORAL | Qty: 1

## 2008-03-13 MED FILL — LEVAQUIN 500 MG/100 ML IN 5% DEXTROSE INTRAVENOUS PIGGYBACK: 500 mg/100 mL | INTRAVENOUS | Qty: 100

## 2008-03-13 MED FILL — ZOLPIDEM 5 MG TAB: 5 mg | ORAL | Qty: 1

## 2008-03-13 MED FILL — NICOTINE 14 MG/24 HR DAILY PATCH: 14 mg/24 hr | TRANSDERMAL | Qty: 1

## 2008-03-13 NOTE — Op Note (Signed)
Op Notes signed by Adele Schilder, MD at 03/14/08 1358                 Author: Adele Schilder, MD  Service: --  Author Type: Physician       Filed: 03/14/08 1358  Date of Service: 03/13/08 1452  Status: Signed          Editor: Adele Schilder, MD (Physician)          <!--EPICS-->                            [facility]<BR>                           [address]<BR>                          [place],  [place]. [address]<BR>                               [phone]<BR> <BR>                             OPERATIVE REPORT<BR> <BR> NAME:  Christine Castillo, Christine Castillo                         MR:  [id number]<HY> LOC:  02  02061             SEX:  F               ACCT:  [id number]  DOB:  07/22/1980            AGE:  28              PT:  I<BR> ADMIT:  03/06/2008          DSCH:                 MSV:  SUR<BR> <BR> DATE OF SURGERY:  03/10/2008<BR> <BR> PRE-PROCEDURE DIAGNOSIS:  Intractable Crohn at the terminal ileum<BR> refractory to  medical treatment.<BR> <BR> SURGERY:  Laparoscopic ileocecectomy with primary anastomosis.<BR> <BR> SURGEON:  Iva Lento, MD<BR> <BR> ASSISTANT:  Jesse Sans. Mikal Plane., MD<BR> <BR> ANESTHESIA:  General endotracheal anesthesia.<BR> <BR> BLOOD LOSS:   Minimum.<BR> <BR> BLOOD REPLACED:  None.<BR> <BR> DRAINS:  None.<BR> <BR> COMPLICATIONS:  None.<BR> <BR> CONDITION AT COMPLETION:  Stable.<BR> <BR> INDICATIONS:  This patient is a 28 year old white female who has had<BR> multiple episodes of hospital  admissions due to intractable pain in the<BR> right lower quadrant.  To this point, she has been treated as a suspected<BR> Crohn disease but has had minimal response to steroid therapy.  CT scan<BR> was consistent with a short segment of disease at the  terminal ileum with<BR> extremely narrowed lumen, thickened bowel and possible mesenteric<BR> abscess.  Given the patient's refractory nature, surgery was consulted<BR> for consideration of removal of the affected segment.  The risk of  doing<BR> this  was discussed with the patient including the risk of fistulas,<BR> abscesses, anastomotic leaks or problems and other unforeseen<BR> complications.  She understood the risks and wished to proceed and<BR> consent was obtained.<BR> <BR> PROCEDURE IN DETAIL:   She was taken to the operating room and placed on<BR> the table in supine position.  General endotracheal anesthesia was<BR> performed.  The abdomen was prepped and draped in a sterile fashion.<BR> Entrance into the peritoneal cavity was gained with  an open Hasson<BR> technique at the umbilicus.  An incision was made above the umbilicus in<BR> a previous incision site.  Dissection to fascia performed bluntly.  The<BR> fascia was opened sharply at the midline with a 15 blade.  Stay sutures<BR> were  placed and Hasson trocar placed within the abdomen.  The abdomen was<BR> insufflated with air.  The patient was placed in slightly Trendelenburg<BR> position and rotated to the left in order to gain access to the terminal<BR> ileum.<BR> <BR> A 10 port  was placed in the left lateral abdomen and a 5 port placed in<BR> the lower midline and another 5 port was placed in the right upper<BR> abdomen.  With these, graspers were introduced and used to inspect the<BR> bowel.  There was an area of dense adhesions  at the ileocecal valve where<BR> the omentum had wrapped itself around this segment.  The area was acutely<BR> inflamed, thickened in nature and very hard on palpation.  Approximately<BR> 10 cm proximal, the bowel felt and appeared normal.  The decision  was<BR> made to remove the affected segment as planned.<BR> <BR> The cecum was mobilized along the white line of Toldt laterally with a<BR> harmonic scalpel and elevated medially.  A place of grossly normal bowel<BR> was chosen 10 cm proximal to the ileocecal  valve and an EndoGIA stapler<BR> was used to divide the small bowel at this point.  The mesentery was then<BR> taken down to the terminal ileum  with harmonic and LigaSure device.  Due<BR> to the scarring at the mesentery, it was somewhat shortened directly  at<BR> the ileocecal valve making dissection difficult.  The colon was divided<BR> just above the cecum at grossly normal bowel with two firings of the<BR> EndoGIA stapler and the mesentery was then taken down back to the<BR> terminal ileum, removing  the segment completely.<BR> <BR> Hemostasis was assured along the mesenteric dissection.  The specimen was<BR> grasped with a gallbladder grasper.  The umbilicus incision was extended<BR> to approximately 3 cm in length and the specimen removed.  The  two<BR> stapled ends of the ileum and colon were then brought up through the<BR> incision to the skin level.  Stay sutures were placed to align the bowel<BR> properly.  Enterotomy was created in each lumen and another firing of the<BR> EndoGIA stapler  was used to create a common anastomosis.  The common<BR> enterotomy was then closed with interrupted 3-0 silk sutures.  Mesentery<BR> was wrapped over the anastomosis for buttressing.<BR> <BR> The bowel was then placed back within the abdomen.  The umbilical<BR>  incision was closed with multiple interrupted #0 Vicryl suture.  The<BR> abdomen was again insufflated with air and a camera introduced to inspect<BR> the anastomosis.  There was no tension on the anastomosis.  It laid<BR> nicely.  Hemostasis was assured  and irrigation was used to irrigate the<BR> right lower quadrant and pelvis.  All fluid was suctioned out.  The ports<BR> were removed under direct vision as insufflation was released.  All the<BR> skin incisions were closed with staples.  The patient  tolerated the<BR> procedure well with no complications and was taken to recovery after<BR> extubation in stable condition.  Dr. Loreta Ave was present and scrubbed<BR> throughout the entire procedure.  All instrument and lap counts were<BR> correct x2 at completion.<BR>  <BR> <BR> <BR> <BR> Iva Lento,  MD         A<BR> <BR>  This is an unverified document unless signed by physician.<BR> <BR> TID:  sw                                       DT:  03/13/2008  2:52 P<BR> JOB:  [id number]        DOC#:  [id number]            DD:  03/10/2008<BR> <BR> cc:   Braulio Bosch, MD<BR>       Iva Lento, MD<BR> <!--EPICE-->

## 2008-03-13 NOTE — Op Note (Signed)
[facility]   [address]   [place], [place]. 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    SURGERY: Laparoscopic ileocecectomy with primary anastomosis.    SURGEON: Iva Lento, MD    ASSISTANT: Jesse Sans. Mikal Plane., MD    ANESTHESIA: General endotracheal anesthesia.    BLOOD LOSS: Minimum.    BLOOD REPLACED: None.    DRAINS: None.    COMPLICATIONS: None.    CONDITION AT COMPLETION: Stable.    INDICATIONS: This patient is a 28 year old white female who has had  multiple episodes of hospital admissions due to intractable pain in the  right lower quadrant. To this point, she has been treated as a suspected  Crohn disease but has had minimal response to steroid therapy. CT scan  was consistent with a short segment of disease at the terminal ileum with  extremely narrowed lumen, thickened bowel and possible mesenteric  abscess. Given the patient's refractory nature, surgery was consulted  for consideration of removal of the affected segment. The risk of doing  this was discussed with the patient including the risk of fistulas,  abscesses, anastomotic leaks or problems and other unforeseen  complications. She understood the risks and wished to proceed and  consent was obtained.    PROCEDURE IN DETAIL: She was taken to the operating room and placed on  the table in supine position. General endotracheal anesthesia was  performed. The abdomen was prepped and draped in a sterile fashion.  Entrance into the peritoneal cavity was gained with an open Hasson  technique at the umbilicus. An incision was made above the umbilicus in  a previous incision site. Dissection to fascia performed bluntly. The   fascia was opened sharply at the midline with a 15 blade. Stay sutures  were placed and Hasson trocar placed within the abdomen. The abdomen was  insufflated with air. The patient was placed in slightly Trendelenburg  position and rotated to the left in order to gain access to the terminal  ileum.    A 10 port was placed in the left lateral abdomen and a 5 port placed in  the lower midline and another 5 port was placed in the right upper  abdomen. With these, graspers were introduced and used to inspect the  bowel. There was an area of dense adhesions at the ileocecal valve where  the omentum had wrapped itself around this segment. The area was acutely  inflamed, thickened in nature and very hard on palpation. Approximately  10 cm proximal, the bowel felt and appeared normal. The decision was  made to remove the affected segment as planned.    The cecum was mobilized along the white line of Toldt laterally with a  harmonic scalpel and elevated medially. A place of grossly normal bowel  was chosen 10 cm proximal to the ileocecal valve and an EndoGIA stapler  was used to divide the small bowel at this point. The mesentery was then  taken down to the terminal ileum with harmonic and LigaSure device. Due  to the scarring at the mesentery, it was somewhat shortened directly at  the ileocecal valve making dissection difficult. The colon  was divided  just above the cecum at grossly normal bowel with two firings of the  EndoGIA stapler and the mesentery was then taken down back to the  terminal ileum, removing the segment completely.    Hemostasis was assured along the mesenteric dissection. The specimen was  grasped with a gallbladder grasper. The umbilicus incision was extended  to approximately 3 cm in length and the specimen removed. The two  stapled ends of the ileum and colon were then brought up through the  incision to the skin level. Stay sutures were placed to align the bowel   properly. Enterotomy was created in each lumen and another firing of the  EndoGIA stapler was used to create a common anastomosis. The common  enterotomy was then closed with interrupted 3-0 silk sutures. Mesentery  was wrapped over the anastomosis for buttressing.    The bowel was then placed back within the abdomen. The umbilical  incision was closed with multiple interrupted #0 Vicryl suture. The  abdomen was again insufflated with air and a camera introduced to inspect  the anastomosis. There was no tension on the anastomosis. It laid  nicely. Hemostasis was assured and irrigation was used to irrigate the  right lower quadrant and pelvis. All fluid was suctioned out. The ports  were removed under direct vision as insufflation was released. All the  skin incisions were closed with staples. The patient tolerated the  procedure well with no complications and was taken to recovery after  extubation in stable condition. Dr. Loreta Ave was present and scrubbed  throughout the entire procedure. All instrument and lap counts were  correct x2 at completion.          Iva Lento, MD A     This is an unverified document unless signed by physician.    TID: sw DT: 03/13/2008 2:52 P  JOB: [id number] DOC#: [id number] DD: 03/10/2008    cc: Braulio Bosch, MD   Iva Lento, MD

## 2008-03-13 NOTE — Progress Notes (Signed)
Has rested at long intervals but continues to c/o pain and nausea.  Denies passing flatus,or having bowel movement.

## 2008-03-13 NOTE — Progress Notes (Addendum)
GI DAILY PROGRESS NOTE    Admit Date:  03/06/2008    Today's Date:  03/13/2008    CC:  Crohn's ileitis    Subjective:     Patient is a 28 year old female with past medical history significant for Crohn's ileitis, Chronic Nausea, Asthma, Tobacco abuse and Anxiety who was admitted on 03/07/07 with Crohn's flare/RLQ pain. She underwent Ileocolectomy on 2/1. She is not feeling any better today. She actually reports more nausea today. Last vomited yesterday morning. She continues to complain of abdominal pain 8-9/10. Yesterday it was a 7-8/10. No BM since surgery. Has not eaten breakfast this morning.     Medications:   Current facility-administered medications   Medication Dose Route Frequency   ??? oxycodone-acetaminophen (PERCOCET 10) 10-325 mg per tablet 1 Tab  1 Tab Oral Q4H PRN   ??? HYDROmorphone (DILAUDID) injection 1 mg  1 mg IntraVENous Q6H PRN   ??? predniSONE (DELTASONE) tablet 15 mg  15 mg Oral DAILY WITH BREAKFAST   ??? promethazine (PHENERGAN) injection 6.25 mg  6.25 mg IntraVENous PRN   ??? phenol throat spray (CHLORASEPTIC) spray 1 Spray  1 Spray Oral PRN   ??? DISCONTD: lactated ringers infusion    IntraVENous CONTINUOUS   ??? zolpidem (AMBIEN) tablet 5 mg  5 mg Oral QHS PRN   ??? lorazepam (ATIVAN) tablet 0.5 mg  0.5 mg Oral Q6H PRN   ??? dextrose 5 % - 0.45% NaCl infusion    IntraVENous CONTINUOUS   ??? metronidazole (FLAGYL) IVPB 500 mg  500 mg IntraVENous Q8H   ??? levofloxacin (LEVAQUIN) 500 mg infusion   500 mg IntraVENous Q24H   ??? ondansetron (ZOFRAN) injection 4 mg  4 mg IntraVENous Q4H PRN   ??? ondansetron (ZOFRAN) injection 2 mg  2 mg IntraVENous Q4H PRN   ??? diphenhydrAMINE (BENADRYL) injection 25 mg  25 mg IntraVENous Q4H PRN   ??? nicotine (NICODERM CQ) 14 mg/24 hr patch 1 Patch  1 Patch TransDERmal DAILY         Review of Systems:  ROS was obtained, with pertinent positives as listed above.    Objective:   Vitals:   BP 113/68   Pulse 89   Temp 97.6 ??F (36.4 ??C)   Resp 18   Ht 5\' 5"  (1.651 m)   Wt 142 lb (64.411 kg)   SpO2 95%   LMP 02/17/2008  Intake/Output:     In: 1913 (1913 I.V.)  Out: 3075 (3075 Urine)    Exam:  General appearance: alert, cooperative, mild distress.  Lungs: clear to auscultation bilaterally anteriorly  Heart: regular rate and rhythm  Abdomen: soft,  nondistended. Dressing on abdomen. Hypoactive bowel sounds. Generalized tenderness with light palpation.  Extremities: extremities normal, atraumatic, no cyanosis or edema  Neuro:  alert and oriented  Psych: Not anxious    Data Review (Labs):      No results found for this basename: WBC:72,HGB:72,HCT:72,PLT:72,MCV:72,NA:72,K:72,CL:72,CO2:72,BUN:72,CREA:72,CA:72,GLU:72,AP:72,SGOT:72,GPT:72,TBIL:72,DBIL:72,CBIL:72,AML:72,LPSE:72,PTP:72,INR:72,APTT:72 in the last 72 hours    Assessment:     Patient Active Hospital Problem List:  Crohn Disease (03/07/2008) S/P Ileocolectomy 03/10/08    RLQ Abdominal Pain (03/07/2008)      Plan:     Continue with supportive care including Zofran, pain medications, Ativan. She is currently on Levaquin and Flagyl. ?Continue. Continue clear diet at this time.       I have seen and examined the patient, and I have directed and agreed to the plan as described.  Post-op care.  On oral prednisone with plan  to taper.  Bari Edward, MD

## 2008-03-13 NOTE — Progress Notes (Signed)
Lying in bed. "I'm nauseated". No one has seen any vomiting. Pt denies flatus or BM.  Nursing reports she is very manipulative and frequently offers to walk if she can get some IV dilaudid.   Af VSS  abd soft, nondistended  Incisions clean and dry  Quite bowel sounds heard in all quadrants    Plan:   Stop IV pain and nausea meds.   Change all meds to PO  Discharge home with return of bowel function.

## 2008-03-14 MED ORDER — PROMETHAZINE 25 MG RECTAL SUPPOSITORY
25 mg | Freq: Four times a day (QID) | RECTAL | Status: AC | PRN
Start: 2008-03-14 — End: 2008-03-21

## 2008-03-14 MED ORDER — OXYCODONE-ACETAMINOPHEN 10 MG-325 MG TAB
10-325 mg | ORAL_TABLET | ORAL | Status: DC | PRN
Start: 2008-03-14 — End: 2008-09-12

## 2008-03-14 MED ADMIN — oxycodone-acetaminophen (PERCOCET 10) 10-325 mg per tablet 1 Tab: ORAL | @ 02:00:00 | NDC 00406052362

## 2008-03-14 MED ADMIN — promethazine (PHENERGAN) suppository 25 mg: RECTAL | @ 06:00:00 | NDC 00713052612

## 2008-03-14 MED ADMIN — dextrose 5 % - 0.45% NaCl infusion: INTRAVENOUS | @ 01:00:00 | NDC 00409792609

## 2008-03-14 MED ADMIN — oxycodone-acetaminophen (PERCOCET 10) 10-325 mg per tablet 1 Tab: ORAL | @ 06:00:00 | NDC 00406052362

## 2008-03-14 MED ADMIN — nicotine (NICODERM CQ) 14 mg/24 hr patch 1 Patch: TRANSDERMAL | @ 01:00:00 | NDC 00067512509

## 2008-03-14 MED ADMIN — zolpidem (AMBIEN) tablet 5 mg: ORAL | @ 06:00:00 | NDC 68084022511

## 2008-03-14 MED ADMIN — oxycodone-acetaminophen (PERCOCET 10) 10-325 mg per tablet 1 Tab: ORAL | @ 16:00:00 | NDC 00406052362

## 2008-03-14 MED FILL — PROMETHEGAN 25 MG RECTAL SUPPOSITORY: 25 mg | RECTAL | Qty: 1

## 2008-03-14 MED FILL — ZOLPIDEM 5 MG TAB: 5 mg | ORAL | Qty: 1

## 2008-03-14 MED FILL — OXYCODONE-ACETAMINOPHEN 10 MG-325 MG TAB: 10-325 mg | ORAL | Qty: 1

## 2008-03-14 MED FILL — NICOTINE 14 MG/24 HR DAILY PATCH: 14 mg/24 hr | TRANSDERMAL | Qty: 1

## 2008-03-14 NOTE — Progress Notes (Signed)
She has started passing gas and asks about going home.    Abdomen is soft with bowel sounds.    Imp: Crohn disease, s/p ileal resection.  Now recovering from surgery.    Plan: I have ordered an appropriate steroid taper; this should be followed when she goes home.    15mg  daily until 03/19/08, then  10 mg daily until 03/26/08, then  5 mg daily until 04/02/08, then stop.    She should follow up with me about 2-3 weeks after discharge.  I will sign off now.  Please call if any questions.    Marland Kitchenike

## 2008-03-14 NOTE — Progress Notes (Signed)
Patient discharged in wheelchair by hospital staff along with belongings, no distress noted

## 2008-03-14 NOTE — Progress Notes (Signed)
Dictated discharge summary

## 2008-03-14 NOTE — Progress Notes (Signed)
Discharge teaching done regarding activity, diet, signs and symptoms to contact MD about, prescriptions given to patient.  Patient verbalized understanding of instructions.

## 2008-03-14 NOTE — Discharge Summary (Signed)
[id number]

## 2008-03-14 NOTE — Progress Notes (Signed)
Patient assessment completed as charted, no needs stated, patient resting in bed, call bell in reach

## 2008-03-17 NOTE — Other (Deleted)
[  The associated scan has been deleted.]

## 2008-03-19 LAB — METABOLIC PANEL, BASIC
Anion gap: 8 mmol/L (ref 7–16)
BUN: 10 MG/DL (ref 7–18)
CO2: 28 MMOL/L (ref 21–32)
Calcium: 9.4 MG/DL (ref 8.4–10.4)
Chloride: 103 MMOL/L (ref 98–107)
Creatinine: 0.8 MG/DL (ref 0.6–1.0)
GFR est AA: >60 mL/min/{1.73_m2} (ref >60)
GFR est non-AA: >60 mL/min/{1.73_m2} (ref >60)
Glucose: 96 MG/DL (ref 74–106)
Potassium: 4.5 MMOL/L (ref 3.5–5.1)
Sodium: 139 MMOL/L (ref 136–145)

## 2008-03-19 LAB — CBC W/O DIFF
HCT: 39 % (ref 37.6–48.3)
HGB: 12.6 g/dL (ref 11.7–15.0)
MCH: 28.5 PG (ref 26.1–32.9)
MCHC: 32.3 g/dL (ref 31.4–35.0)
MCV: 88.2 FL (ref 79.6–97.8)
MPV: 9.3 FL — ABNORMAL LOW (ref 10.8–14.1)
PLATELET: 400 10*3/uL (ref 140–440)
RBC: 4.42 M/uL (ref 3.86–5.18)
RDW: 14.4 % (ref 11.9–14.6)
WBC: 17.2 10*3/uL — ABNORMAL HIGH (ref 4.0–10.5)

## 2008-04-10 NOTE — Discharge Summary (Signed)
[facility]   [address]   [place], [place]. [address]   [phone]     DISCHARGE SUMMARY    NAME: Christine Castillo, Christine Castillo MR: [id number]  LOC: 02 02061 SEX: F ACCT: [id number]  DOB: 18-Jun-1980 AGE: 28 PT: I  ADMIT: 03/06/2008 DSCH: 03/14/2008 MSV: SUR    ADMISSION DIAGNOSES: Intractable Crohn's disease, discharged status post  laparoscopic ileocecectomy with primary anastomosis.    BRIEF COURSE IN THE HOSPITAL: This patient is a 28 year old white female  with multiple admissions for right lower quadrant pain, signs and  symptoms on CT scan consistent with Crohn's disease with short segment  terminal ileum which has failed to respond to medical treatment. The  patient reports that she is unable to afford the medication and she has  not taken it consistently. The CT scan performed during this admission  showed a string sign at the terminal ileum with thickened, inflamed bowel  of that segment. The risks, benefits, and alternatives of the surgery  were discussed in great detail with the patient. The risks of  postoperative complications from Crohn's, including fistulas, abscesses,  anastomotic leaks, recurrence, all of these were explained in great  detail and she understood the risks and wished to proceed with surgery  and consent was obtained.    PROCEDURE IN DETAIL: The patient was taken to the operating room on  03/10/2008 where a laparoscopic ileocecectomy was performed removing the  grossly involved segment. Postoperative pathology was consistent with  Crohn's pathology. Postop, she slowly recovered and due to low pain  tolerance and narcotic resistance, her course is somewhat complicated  with complaints of pain. Clinically, she did fine and she was able to be  advanced on a diet and had return of bowel function. She will be  discharged home in stable condition to follow-up in approximately ten  days in the office. She will follow-up with GI as directed.            Iva Lento, MD      This is an unverified document unless signed by physician.    TID: lea DT: 04/10/2008 9:39 A  JOB: [id number] DOC#: [id number] DD: 03/14/2008    cc: Iva Lento, MD

## 2008-09-12 MED ORDER — PROPOXYPHENE N-ACETAMINOPHEN 100 MG-650 MG TAB
100-650 mg | ORAL_TABLET | Freq: Four times a day (QID) | ORAL | Status: DC | PRN
Start: 2008-09-12 — End: 2008-09-23

## 2008-09-12 NOTE — ED Provider Notes (Signed)
Patient is a 28 y.o. female presenting with dental problem. The history is provided by the patient.   Dental Problem   This is a new problem. Episode onset: 1 month. The problem has been gradually worsening. The pain is located in the left upper and right upper mouth.The quality of the pain is aching.  The pain is moderate. There was no vomiting, no nausea, no fever, no swelling, no headaches, no gum redness and no drainage. The patient has no cardiac history.       Past Medical History   Diagnosis Date   ??? Asthma      exercise induced   ??? Gastrointestinal disorder      crohn's   ??? Other ill-defined conditions      herpes   ??? 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 Crohn's disease           Past Surgical History   Procedure Date   ??? Abdomen surgery proc unlisted      uterus  blockage removed   ??? Hx orthopaedic      r pinkey   ??? Hx cholecystectomy            Family History   Problem Relation   ??? Diabetes Mother          History   Social History   ??? Marital Status: Married     Spouse Name: N/A     Number of Children: 2   ??? Years of Education: N/A   Occupational History   ??? Not on file.   Social History Main Topics   ??? Tobacco Use: Yes -- 1.0 packs/day   ??? Alcohol Use: No   ??? Drug Use: No   ??? Sexually Active: Yes   Other Topics Concern   ??? Not on file   Social History Narrative   ??? No narrative on file           ALLERGIES: Penicillin g      Review of Systems   Constitutional: Negative for fever.   HENT: Positive for dental problem. Negative for facial swelling.    All other systems reviewed and are negative.        Filed Vitals:    09/12/2008  5:33 PM   BP: 121/84   Pulse: 71   Temp: 99.2 ??F (37.3 ??C)   Resp: 18   Height: 5\' 6"  (1.676 m)   Weight: 155 lb (70.308 kg)              Physical Exam   Nursing note and vitals reviewed.  Constitutional: She is oriented to person, place, and time. She appears well-developed and well-nourished. No distress.   HENT:   Head: Normocephalic and atraumatic.    Nose: Nose normal.   Mouth/Throat: Oropharynx is clear and moist. Dental caries present. No dental abscesses.       Eyes: Conjunctivae and extraocular motions are normal. Pupils are equal, round, and reactive to light.   Neck: Normal range of motion. Neck supple.   Cardiovascular: Normal rate, regular rhythm and normal heart sounds.    Pulmonary/Chest: Effort normal and breath sounds normal.   Musculoskeletal: Normal range of motion. She exhibits no edema and no tenderness.   Neurological: She is alert and oriented to person, place, and time.   Skin: Skin is warm and dry. She is not diaphoretic.        MDM Coding   Reviewed: nursing  note and vitals        Procedures

## 2008-09-17 NOTE — ED Notes (Signed)
I am signing this chart for administrative purposes.

## 2008-09-23 MED ORDER — NAPROXEN 500 MG TAB
500 mg | ORAL_TABLET | Freq: Two times a day (BID) | ORAL | Status: AC
Start: 2008-09-23 — End: 2008-10-03

## 2008-09-23 MED ORDER — TRAMADOL 50 MG TAB
50 mg | ORAL_TABLET | Freq: Four times a day (QID) | ORAL | Status: AC | PRN
Start: 2008-09-23 — End: 2008-10-03

## 2008-09-23 MED ADMIN — ketorolac tromethamine (TORADOL) 60 mg/2 mL injection 60 mg: INTRAMUSCULAR | @ 07:00:00 | NDC 63323016202

## 2008-09-23 MED FILL — KETOROLAC TROMETHAMINE 60 MG/2 ML IM: 60 mg/2 mL | INTRAMUSCULAR | Qty: 2
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
## 2008-09-23 NOTE — ED Notes (Signed)
I have reviewed discharge instructions with the patient.  The patient verbalized understanding.

## 2008-09-23 NOTE — ED Notes (Signed)
Patient ambulatory to restroom, steady gait.

## 2009-01-20 NOTE — ED Notes (Signed)
Back from ct

## 2009-01-20 NOTE — ED Notes (Signed)
Patient states pain started 2 days ago withrt mid to lower quad with some small amt bright red and dark bleeding.

## 2009-01-20 NOTE — ED Provider Notes (Cosign Needed)
HPI Comments: Pt with h/o Crohn's and small bowel resection.  She has not followed up with her surgeon since March, as she was feeling better.  She has had difficulty finding a physician to treat her due to her Medicaid.  She states that yesterday she had a BM with a spot of blood, and today she had a BM with bright red blood, and some darker blood.  States she has crampy pain, primarily on the right side.      Patient is a 28 y.o. female presenting with anal bleeding. The history is provided by the patient.   Rectal Bleeding  This is a recurrent problem. The current episode started yesterday. The problem occurs daily. The problem has been gradually worsening. Associated symptoms include abdominal pain. Pertinent negatives include no chest pain, no headaches and no shortness of breath. Exacerbated by: rectal bleeding with bowel movements x2. She has tried nothing for the symptoms.        Past Medical History   Diagnosis Date   ??? Asthma      exercise induced   ??? Other ill-defined conditions      herpes   ??? 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 Crohn's disease    ??? Gastrointestinal disorder      crohn's          Past Surgical History   Procedure Date   ??? Hx orthopaedic      r pinkey   ??? Abdomen surgery proc unlisted      uterus  blockage removed, colon rescec.   ??? Hx cholecystectomy            Family History   Problem Relation Age of Onset   ??? Diabetes Mother           History   Social History   ??? Marital Status: Legally Separated     Spouse Name: N/A     Number of Children: 2   ??? Years of Education: N/A   Occupational History   ??? Not on file.   Social History Main Topics   ??? Tobacco Use: Yes -- 1.0 packs/day   ??? Alcohol Use: No   ??? Drug Use: No   ??? Sexually Active: Yes   Other Topics Concern   ??? Not on file   Social History Narrative   ??? No narrative on file           ALLERGIES: Penicillin g      Review of Systems   Respiratory: Negative for shortness of breath.     Cardiovascular: Negative for chest pain.   Gastrointestinal: Positive for abdominal pain, blood in stool and anal bleeding.        Stool guaiac negative in ER, no stool in rectum    Neurological: Negative for headaches.   All other systems reviewed and are negative.        Filed Vitals:    01/20/2009  5:35 PM   BP: 147/79   Pulse: 93   Temp: 98.7 ??F (37.1 ??C)   Resp: 16   Height: 5' 5.5" (1.664 m)   Weight: 150 lb (68.04 kg)   SpO2: 98%              Physical Exam   Nursing note and vitals reviewed.  Constitutional: She is oriented to person, place, and time. She appears well-developed and well-nourished. No distress.   HENT:   Head: Normocephalic and atraumatic.  Neck: Normal range of motion. Neck supple.   Cardiovascular: Normal rate and regular rhythm.    Pulmonary/Chest: Effort normal and breath sounds normal.   Abdominal: Soft. Normal appearance and bowel sounds are normal. She exhibits no shifting dullness, no distension, no pulsatile liver, no fluid wave, no abdominal bruit, no ascites, no pulsatile midline mass and no mass. There is no organomegaly, splenomegaly or hepatomegaly. Tenderness is present in the right upper quadrant and right lower quadrant. She has no rigidity, no rebound, no guarding, no CVA tenderness, no pain at McBurney's point and no Murphy's sign. No hernia. Hernia confirmed negative in the ventral area.        Stool guaiac negative.  No stool in rectum.   Musculoskeletal: Normal range of motion.   Neurological: She is alert and oriented to person, place, and time.   Skin: Skin is warm and dry.   Psychiatric: She has a normal mood and affect. Her behavior is normal.    CT: No acute disease  No white count  Afebrile   I discussed the plan of care with the patient- she does not appear to have any acute process at this time.  I have explained that Crohn's waxes and wanes, and she may have flair ups from time to time.  She should see a GI ASAP.  [facility] info given.  Pt able to verbalize understanding.  Pt was observed resting comfortably, and appeared to be in no acute distress.    Coding    Procedures

## 2009-01-20 NOTE — ED Notes (Signed)
I have reviewed discharge instructions with the patient.  The patient verbalized understanding.

## 2009-01-20 NOTE — ED Notes (Signed)
To ct

## 2009-01-21 LAB — CBC WITH AUTOMATED DIFF
ABS. BASOPHILS: 0 10*3/uL (ref 0.0–0.2)
ABS. EOSINOPHILS: 0.4 10*3/uL (ref 0.0–0.8)
ABS. IMM. GRANS.: 0 10*3/uL (ref 0.0–2.0)
ABS. LYMPHOCYTES: 5.4 10*3/uL — ABNORMAL HIGH (ref 0.5–4.6)
ABS. MONOCYTES: 0.9 10*3/uL (ref 0.1–1.3)
ABS. NEUTROPHILS: 7.2 10*3/uL (ref 1.7–8.2)
BASOPHILS: 0 % (ref 0.0–2.0)
EOSINOPHILS: 3 % (ref 0.5–7.8)
HCT: 37.7 % (ref 37.6–48.3)
HGB: 12.7 g/dL (ref 11.7–15.0)
IMMATURE GRANULOCYTES: 0.2 % (ref 0.0–2.0)
LYMPHOCYTES: 38 % (ref 13–44)
MCH: 29.4 PG (ref 26.1–32.9)
MCHC: 33.7 g/dL (ref 31.4–35.0)
MCV: 87.3 FL (ref 79.6–97.8)
MONOCYTES: 7 % (ref 4.0–12.0)
MPV: 10.3 FL — ABNORMAL LOW (ref 10.8–14.1)
NEUTROPHILS: 52 % (ref 43–78)
PLATELET: 320 10*3/uL (ref 140–440)
RBC: 4.32 M/uL (ref 3.86–5.18)
RDW: 12.4 % (ref 11.9–14.6)
WBC: 13.9 10*3/uL — ABNORMAL HIGH (ref 4.0–10.5)

## 2009-01-21 LAB — METABOLIC PANEL, COMPREHENSIVE
A-G Ratio: 0.9 — ABNORMAL LOW (ref 1.2–3.5)
ALT (SGPT): 33 U/L — ABNORMAL LOW (ref 39–65)
AST (SGOT): 13 U/L — ABNORMAL LOW (ref 15–37)
Albumin: 3.3 g/dL — ABNORMAL LOW (ref 3.5–5.0)
Alk. phosphatase: 121 U/L (ref 50–136)
Anion gap: 8 mmol/L (ref 7–16)
BUN: 9 MG/DL (ref 7–18)
Bilirubin, total: 0.2 MG/DL (ref 0.2–1.1)
CO2: 27 MMOL/L (ref 21–32)
Calcium: 8.9 MG/DL (ref 8.4–10.4)
Chloride: 106 MMOL/L (ref 98–107)
Creatinine: 1 MG/DL (ref 0.6–1.0)
GFR est AA: >60 mL/min/{1.73_m2} (ref >60)
GFR est non-AA: >60 mL/min/{1.73_m2} (ref >60)
Globulin: 3.5 g/dL (ref 2.3–3.5)
Glucose: 101 MG/DL — ABNORMAL HIGH (ref 65–100)
Potassium: 3.7 MMOL/L (ref 3.5–5.1)
Protein, total: 6.8 g/dL (ref 6.3–8.2)
Sodium: 141 MMOL/L (ref 136–145)

## 2009-01-21 LAB — HCG URINE, QL. - POC: Pregnancy test,urine (POC): NEGATIVE

## 2009-01-21 MED ORDER — TRAMADOL 50 MG TAB
50 mg | ORAL_TABLET | Freq: Four times a day (QID) | ORAL | Status: AC | PRN
Start: 2009-01-21 — End: 2009-01-25

## 2009-01-21 MED ORDER — PROMETHAZINE 25 MG TAB
25 mg | ORAL_TABLET | Freq: Four times a day (QID) | ORAL | Status: AC | PRN
Start: 2009-01-21 — End: 2009-01-25

## 2009-01-21 MED ADMIN — 0.9% sodium chloride infusion: INTRAVENOUS | @ 01:00:00 | NDC 00409798309

## 2009-01-21 MED ADMIN — ondansetron (ZOFRAN) injection 4 mg: INTRAVENOUS | @ 02:00:00 | NDC 00143989105

## 2009-01-21 MED ADMIN — ioversol (OPTIRAY) 350 mg/mL contrast solution 125 mL: INTRAVENOUS | @ 03:00:00 | NDC 00019133321

## 2009-01-21 MED ADMIN — hydrocodone-acetaminophen (LORTAB) 5-500 mg per tablet 1 Tab: ORAL | @ 04:00:00 | NDC 00406035762

## 2009-01-21 MED ADMIN — diatrizoate meglumine & sodium (MD-GASTROVIEW) 66-10 % contrast solution 30 mL: ORAL | @ 03:00:00 | NDC 00019481605

## 2009-01-21 MED FILL — HYDROCODONE-ACETAMINOPHEN 5 MG-500 MG TAB: 5-500 mg | ORAL | Qty: 1

## 2009-01-21 MED FILL — ONDANSETRON (PF) 4 MG/2 ML INJECTION: 4 mg/2 mL | INTRAMUSCULAR | Qty: 2

## 2009-06-03 NOTE — ED Provider Notes (Signed)
Patient is a 29 y.o. female presenting with fall. The history is provided by the patient.   Fall  The accident occurred yesterday. The fall occurred while standing. She fell from a height of 1 - 2 ft. She landed on hard floor. The point of impact was the left hip (left ribs). The pain is present in the left hip (left ribs). The pain is at a severity of 5/10. The pain is moderate. She was ambulatory at the scene. There was no entrapment after the fall. There was no drug use involved in the accident. There was no alcohol use involved in the accident. Pertinent negatives include no visual change, no fever, no numbness, no abdominal pain, no bowel incontinence, no nausea, no vomiting, no hematuria, no headaches, no extremity weakness, no hearing loss, no loss of consciousness, no tingling and no laceration. The symptoms are aggravated by use of injured limb (deep breaths). She has tried nothing for the symptoms.        Past Medical History   Diagnosis Date   ??? Asthma      exercise induced   ??? Other ill-defined conditions      herpes   ??? 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 Crohn's disease    ??? Gastrointestinal disorder      crohn's          Past Surgical History   Procedure Date   ??? Hx orthopaedic      r pinkey   ??? Abdomen surgery proc unlisted      uterus  blockage removed, colon rescec.   ??? Hx cholecystectomy            Family History   Problem Relation Age of Onset   ??? Diabetes Mother           History   Social History   ??? Marital Status: Legally Separated     Spouse Name: N/A     Number of Children: 2   ??? Years of Education: N/A   Occupational History   ??? Not on file.   Social History Main Topics   ??? Smoking status: Current Everyday Smoker -- 1.0 packs/day   ??? Smokeless tobacco: Never Used   ??? Alcohol Use: No   ??? Drug Use: No   ??? Sexually Active: Yes   Other Topics Concern   ??? Not on file   Social History Narrative   ??? No narrative on file           ALLERGIES: Penicillin g       Review of Systems   Constitutional: Negative.  Negative for fever.   HENT: Negative.    Eyes: Negative.    Gastrointestinal: Negative.  Negative for nausea, vomiting and abdominal pain.   Genitourinary: Negative.  Negative for hematuria.   Musculoskeletal: Positive for arthralgias and gait problem. Negative for extremity weakness.   Skin: Negative.    Neurological: Negative.  Negative for tingling, loss of consciousness, numbness and headaches.   Hematological: Negative.    Psychiatric/Behavioral: Negative.    All other systems reviewed and are negative.        Filed Vitals:    06/03/09 2308   BP: 138/85   Pulse: 92   Temp: 99.2 ??F (37.3 ??C)   Resp: 16   Height: 5\' 5"  (1.651 m)   Weight: 180 lb (81.647 kg)   SpO2: 99%  Physical Exam   Nursing note and vitals reviewed.  Constitutional: She is oriented to person, place, and time. She appears well-developed and well-nourished. No distress.   HENT:   Head: Normocephalic and atraumatic.   Mouth/Throat: Oropharynx is clear and moist.   Eyes: Conjunctivae and extraocular motions are normal. Pupils are equal, round, and reactive to light. Right eye exhibits no discharge. Left eye exhibits no discharge.   Neck: Normal range of motion. Neck supple. No JVD present. No tracheal deviation present.   Cardiovascular: Normal rate.    Pulmonary/Chest: Effort normal. No stridor. She exhibits tenderness.         Musculoskeletal: Normal range of motion. She exhibits tenderness. She exhibits no edema.        Legs:    Neurological: She is alert and oriented to person, place, and time. No cranial nerve deficit. She exhibits normal muscle tone.   Skin: Skin is warm and dry. No laceration and no rash noted. She is not diaphoretic. No erythema. No pallor.   Psychiatric: She has a normal mood and affect. Her behavior is normal. Judgment and thought content normal.        MDM     Amount and/or Complexity of Data Reviewed:   Clinical lab tests:  Reviewed and ordered   Tests in the radiology section of CPT??:  Reviewed and ordered  Risk of Significant Complications, Morbidity, and/or Mortality:   Presenting problems:  Low  Diagnostic procedures:  Low  Management options:  Low  Progress:   Patient progress:  Stable      Procedures

## 2009-06-04 LAB — URINE MICROSCOPIC
Casts: 0 /LPF
Crystals, urine: 0 /LPF
Mucus: 0 /LPF
RBC: 0 /HPF

## 2009-06-04 LAB — HCG URINE, QL. - POC: Pregnancy test,urine (POC): NEGATIVE

## 2009-06-04 MED ORDER — HYDROCODONE-ACETAMINOPHEN 10 MG-500 MG TAB
10-500 mg | ORAL_TABLET | Freq: Four times a day (QID) | ORAL | Status: DC | PRN
Start: 2009-06-04 — End: 2009-07-22

## 2009-06-04 MED ADMIN — hydrocodone-acetaminophen (LORTAB) 10-500 mg per tablet 1 Tab: ORAL | @ 05:00:00 | NDC 00406036362

## 2009-06-04 MED FILL — HYDROCODONE-ACETAMINOPHEN 10 MG-500 MG TAB: 10-500 mg | ORAL | Qty: 1

## 2009-06-04 NOTE — ED Notes (Signed)
Client to xray via hospital transport

## 2009-06-04 NOTE — ED Notes (Signed)
I have reviewed discharge instructions with the patient.  The patient verbalized understanding. Pt ambulatory from room, vss.

## 2009-07-01 NOTE — Progress Notes (Signed)
Ambulatory/Rehab Services H2 Model Falls Risk Assessment    Risk Factor Pts. ??   Confusion/Disorientation/Impulsivity  []   4 ??   Symptomatic Depression  []  2 ??   Altered Elimination  []  1 ??   Dizziness/Vertigo  []  1 ??   Gender (Female)  []  1 ??   Any administered antiepileptics (anticonvulsants):  []  2 ??   Any administered benzodiazepines:  []  1 ??   Visual Impairment (specify):  []  1 ??   Portable Oxygen Use  []  1 ??   Orthostatic ? BP  []  1 ??   History of Recent Falls (within 3 mos.)  [x]  5     Ability to Rise from Chair (choose one) Pts. ??   Ability to rise in a single movement  [x]  0 ??   Pushes up, successful in one attempt  []  1 ??   Multiple attempts, but unsuccessful  []  3 ??   Unable to rise without assistance  []  4   Total: (5 or greater = High Risk) 5     Falls Prevention Plan:   []               Physical Limitations to Exercise (specify):   []               Mobility Assistance Device (type):   []               Exercise/Equipment Adaptation (specify):    ??2010 [employer] All Rights Reserved. [place] Patent #7,282,031. Federal Law prohibits the replication, distribution or use without written permission from [employer]

## 2009-07-01 NOTE — Progress Notes (Signed)
[facility] [address]  [address]   [place], [place] [address]  Phone: [phone]   Fax: [phone]    OUTPATIENT ORTHOPAEDIC PHYSICAL THERAPY  [x]       Initial Assessment     []       Daily Note      []       Progress Report     []       Recertification    []       Discharge  []       Patient's Fall Risk Score: 5 (5 or greater = High Risk)    NAME/AGE/GENDER: Christine Castillo is a 29 y.o. female    DATE: 07/01/2009    REFERRING PHYSICIAN: Dr. Kathie Rhodes  Return Physician Appointment: Nothing scheduled  MEDICAL/REFERRING DIAGNOSIS: Left hip pain  Treatment Diagnosis: Pain in joint, pelvic region and thigh  DATE OF ONSET: 05/26/09  PRIOR LEVEL OF FUNCTION: [place]  PRECAUTIONS/ALLERGIES: Penicillin    SUBJECTIVE     Present Symptoms: Pt states that she has some days that are better than other. When she comes home from work she can barely walk. Pt states pain is in the left hip and shoots down to left knee. Occasional sore ankle as pt seems to think its due to the way she walks. Pain level at present 3/10, at worst 8/10. Pain is aggravated with all movement and better with loritab. Pt states that upon falling she hurt her shoulder and wrist, however she has had x-rays and they do not hurt anymore.      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      Work/Activity History: Works at [employer]    OBJECTIVE     Outcome Measure:   Outcome Measure:    Tool Used: Lower Extremity Functional Scale (LEFS)  Score:  Initial: 20/80 Most Recent: /80   Interpretation of Score: 20 questions each scored on a 5 point scale with 1 representing "extreme difficulty or unable to perform" and 5 representing "no difficulty".  The lower the score, the greater the functional disability. 80/80 represents no disability.  Minimal detectable change is 9 points.  Observation/Orthostatic Postural Assessment:  Negative lateral shift, decreased drop in left pelvis upon unilateral stance     Palpation:  Tenderness left greater trochanter, left ASIS, left PSIS     ROM: Bilateral hip AROM WFL, Painful left hip flexion,abduction, IR/ER                          Strength:      LLE Strength  L Hip Flexion: 3  L Hip ABduction: 3  L Knee Flexion: 4  L Knee Extension: 4  L Ankle Dorsiflexion: 3+    RLE Strength  R Hip Flexion: 4+  R Hip ABduction: 4+  R Knee Extension: 4+  R Ankle Dorsiflexion: 4+  R Ankle Plantar Flexion: 4+      Special Tests: Neurological Screen:   Myotomes:      LLE Myotomes  L1, L2 (Hip Flexion): Weak  L3, L4 (Knee Extension): Strong  L4, L5 (Dorsiflexion): Weak  L5 (Great Toe Extension/Eversion): Weak  S1 (Leg Curl): Strong  S1, S2 (Toe Raise): Strong    RLE Myotomes  L1, L2 (Hip Flexion): Strong  L3, L4 (Knee Extension): Strong  L4, L5 (Dorsiflexion): Strong  L5 (Great Toe Extension/Eversion): Strong  S1 (Leg Curl): Strong  S1, S2 (Toe Raise): Strong      Dermatomes: WNL                 Reflexes:      Left Knee (L3,L4): 2+  Left Ankle (S1,S2): 2+    Right Knee (L3,L4): 2+  Right Ankle (S1,S2): 2+      Neural Tension Tests:   Functional Mobility:  WNL     Balance: WNL      TREATMENT   Initial evaluation: 45 minutes  Manual Therapy (     ):   Therapeutic Exercise ( ):   HEP: As above; handouts given to patient for all exercises.  Gait Training ( ):       Therapeutic Activity (   ):    Neuromuscular Re-education ( ):   Therapeutic Modalities:    Electrical stimulation 80-150 hz to tolerance to left inguinal region x 15 minutes , pt in supine with legs elevated  Ultrasound 1/5 w/cm2 to left hip region x 8 minutes, pt in supine with legs elevated  Kinesio taping to left pelvic region, anchor on left ASIS with end around lumbar paraspinals with 75% tension  Kinesio taping with anchor on left greater trochanter with end on left ASIS  With 75% tension.                                                                                              ASSESSMENT   Pt is a 29 year old female who presents with left lower extremity weakness and pain in left hip limiting all functional mobility that involves weight bearing. Pt's job is primarily a standing position which aggravates her symptoms and limits her quality of life. Pt is extremely motivated towards goals and will benefit from therapy at this time, requires at least 6 visits to ease pain and restore strength in left hip.   Response:  Patient???s response to today???s treatment session was tolerated well with no complications.  Upon completion of treatment, skin condition was clear.  Progression: Will assess as treatment progresses.     Compliance with Program/Exercises: Will assess as treatment progresses.   Other Comments:  Pt given HEP for land and pool  **This section established at most recent assessment**  PROBLEM LIST (Impairments causing functional limitations):  1.  Pain in left hip  2.  Decreased strength in left hip  3.  Tenderness in left hip  4.  Decreased independence with HEP  GOALS: (Goals have been discussed and agreed upon with patient.)  SHORT-TERM FUNCTIONAL GOALS: Time Frame: 2 weeks  1.  Pt to report a decrease in pain down to 3-5/10 after a long day of work.  2.  Pt to demonstrate 1/2 grade  increase in left lower extremity strength to allow for symmetrical gait.   3.  Pt to report a decrease in tenderness in pelvic and hip region allowing for manual therapy if indicated.   4.  Pt to achieve independence with HEP to maintain goal achieved.  DISCHARGE GOALS: Time Frame: 4 weeks  1.  Pt to tolerate 4 hours of weight bearing without an increase in pain levels in left hip.  2.  Pt to tolerate A/PROM to left hip without increase in symptoms indicating restored joint mobility.   3.  Pt to return to all daily activities without pain/difficulty.   REHABILITATION POTENTIAL FOR STATED GOALS: Good     PLAN OF CARE     INTERVENTIONS PLANNED: (Benefits and precautions of physical therapy have been discussed with the patient.)  1.  There ex land and aquatic   2.  Modalities; electrical stimulation, ultrasound,moist heat, cold pack, kinesio taping  3.  Manual therapy  TREATMENT PLAN EFFECTIVE DATES: 06/01/2009 TO 07/01/2009  PLAN: Continue to follow patient 2 times a week for until goals met to address above goals.  Recommendations/Intent for next treatment session: Pt will benefit from 6 visits of therapy to regain functional mobility and address left hip pain.   PT Patient Time In/Time Out  Time In: 0900  Time Out: 0945    Regarding Perina G Fenlon's therapy, I certify that the treatment plan above will be carried out by a therapist or under their direction.  Thank you for this referral,  Monte Fantasia, PT                      Physician Signature      Date

## 2009-07-23 LAB — HCG URINE, QL. - POC: Pregnancy test,urine (POC): NEGATIVE

## 2009-07-23 MED ORDER — HYDROCODONE-ACETAMINOPHEN 7.5 MG-500 MG TAB
ORAL | Status: AC
Start: 2009-07-23 — End: 2009-07-23
  Administered 2009-07-23: 08:00:00 via ORAL

## 2009-07-23 MED ORDER — HYDROCODONE-ACETAMINOPHEN 7.5 MG-500 MG TAB
ORAL_TABLET | Freq: Four times a day (QID) | ORAL | Status: DC | PRN
Start: 2009-07-23 — End: 2009-09-15

## 2009-07-23 MED ORDER — KETOROLAC TROMETHAMINE 60 MG/2 ML IM
60 mg/2 mL | INTRAMUSCULAR | Status: AC
Start: 2009-07-23 — End: 2009-07-23
  Administered 2009-07-23: 06:00:00 via INTRAMUSCULAR

## 2009-07-23 MED FILL — HYDROCODONE-ACETAMINOPHEN 7.5 MG-500 MG TAB: ORAL | Qty: 1

## 2009-07-23 MED FILL — KETOROLAC TROMETHAMINE 60 MG/2 ML IM: 60 mg/2 mL | INTRAMUSCULAR | Qty: 2

## 2009-07-23 NOTE — ED Notes (Signed)
Films do not show any fx or bony abnormality. Discussed results with pt. She has been going to [facility] on [address]

## 2009-07-23 NOTE — ED Notes (Signed)
Pt put out her cigarette and ambulated without assisstance to room. Pt c/o L hip pain.

## 2009-07-23 NOTE — ED Notes (Signed)
I have reviewed discharge instructions with the patient.  The patient verbalized understanding. Pt ambulatory from room, vss.

## 2009-07-23 NOTE — ED Provider Notes (Signed)
HPI Comments: Pt stated she fell and landed directly on her left hip in April. Since that time she has had inc pain with rom of hip.     Patient is a 29 y.o. female presenting with hip pain. The history is provided by the patient.   Hip Pain   This is a new problem. The current episode started more than 1 week ago. The problem occurs constantly. The problem has been gradually worsening. The pain is present in the left hip and left upper leg. The quality of the pain is described as aching. The pain is severe. Associated symptoms include limited range of motion and stiffness. Pertinent negatives include no numbness, no tingling, no back pain and no neck pain. The symptoms are aggravated by activity and movement. Treatments tried: pain med. There has been a history of trauma.        Past Medical History   Diagnosis Date   ??? Asthma      exercise induced   ??? Other ill-defined conditions      herpes   ??? 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 Crohn's disease    ??? Gastrointestinal disorder      crohn's          Past Surgical History   Procedure Date   ??? Hx orthopaedic      r pinkey   ??? Abdomen surgery proc unlisted      uterus  blockage removed, colon rescec.   ??? Hx cholecystectomy            Family History   Problem Relation Age of Onset   ??? Diabetes Mother           History   Social History   ??? Marital Status: Legally Separated     Spouse Name: N/A     Number of Children: 2   ??? Years of Education: N/A   Occupational History   ??? Not on file.   Social History Main Topics   ??? Smoking status: Current Everyday Smoker -- 1.0 packs/day   ??? Smokeless tobacco: Never Used   ??? Alcohol Use: No   ??? Drug Use: No   ??? Sexually Active: Yes   Other Topics Concern   ??? Not on file   Social History Narrative   ??? No narrative on file                    ALLERGIES: Penicillin g      Review of Systems   Constitutional: Positive for activity change.   HENT: Negative for neck pain.     Musculoskeletal: Positive for arthralgias and stiffness. Negative for back pain.   Neurological: Negative for tingling and numbness.   All other systems reviewed and are negative.        Filed Vitals:    07/22/09 2348   BP: 135/82   Pulse: 90   Temp: 97.8 ??F (36.6 ??C)   Resp: 18   Height: 5\' 5"  (1.651 m)   Weight: 165 lb (74.844 kg)   SpO2: 98%              Physical Exam   Nursing note and vitals reviewed.  Constitutional: She is oriented to person, place, and time. She appears well-developed and well-nourished. No distress.   HENT:   Head: Normocephalic and atraumatic.   Eyes: EOM are normal. Pupils are equal, round, and reactive to light.   Neck:  Normal range of motion. Neck supple.   Pulmonary/Chest: Effort normal.   Musculoskeletal: Normal range of motion. She exhibits tenderness. She exhibits no edema.        Pain with rom of left hip   Lymphadenopathy:     She has no cervical adenopathy.   Neurological: She is alert and oriented to person, place, and time.   Skin: Skin is warm and dry.   Psychiatric: She has a normal mood and affect.        MDM    Procedures    Hip films do not show any injury

## 2009-09-15 NOTE — ED Provider Notes (Signed)
HPI Comments: Here with blood mixed with mucous stools. Has since mid-day more intense abd pain. No fever. Has Crohn's and in past had perforation with resection related to this. Is currently not under care of any GI group. Otherwise is healthy with no baseline illnesses. Has been some time since on Prednisone for Crohn's.    Patient is a 29 y.o. female presenting with abdominal pain. The history is provided by the patient.   Abdominal Pain   This is a new problem. The current episode started 6 to 12 hours ago. The problem occurs constantly. The problem has not changed since onset. Associated With: bloody stools. The pain is located in the generalized abdominal region. The pain is moderate. Associated symptoms include diarrhea. Pertinent negatives include no fever, no nausea, no vomiting, no dysuria, no frequency, no hematuria and no back pain. Nothing worsens the pain. The pain is relieved by nothing. Past workup includes CT scan and colonoscopy. perf with resection       Past Medical History   Diagnosis Date   ??? Asthma      exercise induced   ??? Other ill-defined conditions      herpes   ??? 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 Crohn's disease    ??? Gastrointestinal disorder      crohn's          Past Surgical History   Procedure Date   ??? Hx orthopaedic      r pinkey   ??? Abdomen surgery proc unlisted      uterus  blockage removed, colon rescec.   ??? Hx cholecystectomy            Family History   Problem Relation Age of Onset   ??? Diabetes Mother           History   Social History   ??? Marital Status: Legally Separated     Spouse Name: N/A     Number of Children: 2   ??? Years of Education: N/A   Occupational History   ??? Not on file.   Social History Main Topics   ??? Smoking status: Current Everyday Smoker -- 1.0 packs/day   ??? Smokeless tobacco: Never Used   ??? Alcohol Use: No   ??? Drug Use: No   ??? Sexually Active: Yes   Other Topics Concern   ??? Not on file   Social History Narrative    ??? No narrative on file                    ALLERGIES: Penicillin g      Review of Systems   Constitutional: Negative for fever, chills and diaphoresis.   HENT: Negative.    Cardiovascular: Negative.    Gastrointestinal: Positive for abdominal pain, diarrhea and blood in stool. Negative for nausea and vomiting.   Genitourinary: Negative for dysuria, frequency and hematuria.   Musculoskeletal: Negative.  Negative for back pain.   Hematological: Does not bruise/bleed easily.   All other systems reviewed and are negative.        Filed Vitals:    09/15/09 2006 09/15/09 2007 09/15/09 2020 09/15/09 2040   BP: 110/73  121/71 115/79   Pulse: 72  75 86   Temp:       Resp:  18     Height:       Weight:       SpO2: 98%  97% 99%  Physical Exam   Nursing note and vitals reviewed.  Constitutional: She is oriented to person, place, and time. No distress.        Uncomfortable but not toxic   HENT:   Head: Atraumatic.   Eyes: Conjunctivae are normal. No scleral icterus.   Neck: Neck supple.   Cardiovascular: Normal rate, regular rhythm and normal heart sounds.    Pulmonary/Chest: Effort normal and breath sounds normal. No respiratory distress.   Abdominal: Soft. She exhibits no mass. Tenderness is present. She has no rebound and no guarding.        Negative shake. No acute peritoneal signs   Neurological: She is alert and oriented to person, place, and time. No cranial nerve deficit. She exhibits normal muscle tone.   Skin: Skin is warm and dry. She is not diaphoretic. No erythema.   Psychiatric: Her behavior is normal. Thought content normal.        MDM     Amount and/or Complexity of Data Reviewed:   Clinical lab tests:  Reviewed and ordered  Tests in the radiology section of CPT??:  Reviewed and ordered (Dr Sarita Haver)   Independant visualization of image, tracing, or specimen:  Yes  Risk of Significant Complications, Morbidity, and/or Mortality:   Presenting problems:  Moderate  Diagnostic procedures:  Moderate   Management options:  Moderate  Progress:   Patient progress:  Improved      Procedures    Recent Results (from the past 12 hour(s))   CBC WITH AUTOMATED DIFF    Collection Time    09/15/09  8:00 PM   Component Value Range   ??? WBC 18.3 (*) 4.3 - 11.1 (K/uL)   ??? RBC 4.38  4.05 - 5.25 (M/uL)   ??? HGB 13.1  11.7 - 15.4 (g/dL)   ??? HCT 38.3  35.8 - 46.3 (%)   ??? MCV 87.4  79.6 - 97.8 (FL)   ??? MCH 29.9  26.1 - 32.9 (PG)   ??? MCHC 34.2  31.4 - 35.0 (g/dL)   ??? RDW 13.1  11.9 - 14.6 (%)   ??? PLATELET 334  150 - 450 (K/uL)   ??? MPV 10.2 (*) 10.8 - 14.1 (FL)   ??? DF AUTOMATED     ??? NEUTROPHILS 63  43 - 78 (%)   ??? LYMPHOCYTES 30  13 - 44 (%)   ??? MONOCYTES 5  4.0 - 12.0 (%)   ??? EOSINOPHILS 2  0.5 - 7.8 (%)   ??? BASOPHILS 0  0.0 - 2.0 (%)   ??? ABSOLUTE NEUTS 11.6 (*) 1.7 - 8.2 (K/UL)   ??? ABSOLUTE LYMPHS 5.4 (*) 0.5 - 4.6 (K/UL)   ??? ABSOLUTE MONOS 0.8  0.1 - 1.3 (K/UL)   ??? ABSOLUTE EOSINS 0.3  0.0 - 0.8 (K/UL)   ??? ABSOLUTE BASOS 0.1  0.0 - 0.2 (K/UL)   ??? IMM. GRANS. 0.5  0.0 - 2.0 (%)   ??? ABS. IMM. GRANS. 0.1  0.0 - 2.0 (K/UL)   METABOLIC PANEL, COMPREHENSIVE    Collection Time    09/15/09  8:00 PM   Component Value Range   ??? Sodium 139  136 - 145 (MMOL/L)   ??? Potassium 3.6  3.5 - 5.1 (MMOL/L)   ??? Chloride 106  98 - 107 (MMOL/L)   ??? CO2 25  23 - 32 (MMOL/L)   ??? Anion gap 8  7 - 16 (mmol/L)   ??? Glucose 84  65 - 100 (MG/DL)   ??? BUN  7  6 - 23 (MG/DL)   ??? Creatinine 0.8  0.6 - 1.5 (MG/DL)   ??? GFR est AA >60  >60 (ml/min/1.38m2)   ??? GFR est non-AA >60  >60 (ml/min/1.56m2)   ??? Calcium 9.1  8.3 - 10.4 (MG/DL)   ??? Bilirubin, total 0.4  0.2 - 1.1 (MG/DL)   ??? ALT 26 (*) 39 - 65 (U/L)   ??? AST 13 (*) 15 - 37 (U/L)   ??? Alk. phosphatase 155 (*) 50 - 136 (U/L)   ??? Protein, total 7.1  6.3 - 8.2 (g/dL)   ??? Albumin 3.5  3.5 - 5.0 (g/dL)   ??? Globulin 3.6 (*) 2.3 - 3.5 (g/dL)   ??? A-G Ratio 1.0 (*) 1.2 - 3.5 ( )   SED RATE (ESR)    Collection Time    09/15/09  8:00 PM   Component Value Range   ??? Sed rate (ESR) 25 (*) 0 - 20 (MM/HR)

## 2009-09-15 NOTE — ED Notes (Signed)
Denies diarrhea, stools have had some blood in them today. Decreased appetite and generalized abd pain since "around noon".

## 2009-09-15 NOTE — ED Notes (Signed)
Pt finished contrast; ct notified

## 2009-09-15 NOTE — ED Notes (Signed)
Pt started drinking contrast

## 2009-09-15 NOTE — ED Notes (Signed)
Bedside report given to oncoming nurse.

## 2009-09-15 NOTE — ED Notes (Signed)
rec'd report from Debbie, RN

## 2009-09-16 LAB — CBC WITH AUTOMATED DIFF
ABS. BASOPHILS: 0.1 10*3/uL (ref 0.0–0.2)
ABS. EOSINOPHILS: 0.3 10*3/uL (ref 0.0–0.8)
ABS. IMM. GRANS.: 0.1 10*3/uL (ref 0.0–2.0)
ABS. LYMPHOCYTES: 5.4 10*3/uL — ABNORMAL HIGH (ref 0.5–4.6)
ABS. MONOCYTES: 0.8 10*3/uL (ref 0.1–1.3)
ABS. NEUTROPHILS: 11.6 10*3/uL — ABNORMAL HIGH (ref 1.7–8.2)
BASOPHILS: 0 % (ref 0.0–2.0)
EOSINOPHILS: 2 % (ref 0.5–7.8)
HCT: 38.3 % (ref 35.8–46.3)
HGB: 13.1 g/dL (ref 11.7–15.4)
IMMATURE GRANULOCYTES: 0.5 % (ref 0.0–2.0)
LYMPHOCYTES: 30 % (ref 13–44)
MCH: 29.9 PG (ref 26.1–32.9)
MCHC: 34.2 g/dL (ref 31.4–35.0)
MCV: 87.4 FL (ref 79.6–97.8)
MONOCYTES: 5 % (ref 4.0–12.0)
MPV: 10.2 FL — ABNORMAL LOW (ref 10.8–14.1)
NEUTROPHILS: 63 % (ref 43–78)
PLATELET: 334 10*3/uL (ref 150–450)
RBC: 4.38 M/uL (ref 4.05–5.25)
RDW: 13.1 % (ref 11.9–14.6)
WBC: 18.3 10*3/uL — ABNORMAL HIGH (ref 4.3–11.1)

## 2009-09-16 LAB — METABOLIC PANEL, COMPREHENSIVE
A-G Ratio: 1 — ABNORMAL LOW (ref 1.2–3.5)
ALT (SGPT): 26 U/L — ABNORMAL LOW (ref 39–65)
AST (SGOT): 13 U/L — ABNORMAL LOW (ref 15–37)
Albumin: 3.5 g/dL (ref 3.5–5.0)
Alk. phosphatase: 155 U/L — ABNORMAL HIGH (ref 50–136)
Anion gap: 8 mmol/L (ref 7–16)
BUN: 7 MG/DL (ref 6–23)
Bilirubin, total: 0.4 MG/DL (ref 0.2–1.1)
CO2: 25 MMOL/L (ref 23–32)
Calcium: 9.1 MG/DL (ref 8.3–10.4)
Chloride: 106 MMOL/L (ref 98–107)
Creatinine: 0.8 MG/DL (ref 0.6–1.5)
GFR est AA: >60 mL/min/{1.73_m2} (ref >60)
GFR est non-AA: >60 mL/min/{1.73_m2} (ref >60)
Globulin: 3.6 g/dL — ABNORMAL HIGH (ref 2.3–3.5)
Glucose: 84 MG/DL (ref 65–100)
Potassium: 3.6 MMOL/L (ref 3.5–5.1)
Protein, total: 7.1 g/dL (ref 6.3–8.2)
Sodium: 139 MMOL/L (ref 136–145)

## 2009-09-16 LAB — SED RATE (ESR): Sed rate (ESR): 25 MM/HR — ABNORMAL HIGH (ref 0–20)

## 2009-09-16 MED ORDER — HYDROCODONE-ACETAMINOPHEN 5 MG-500 MG TAB
5-500 mg | ORAL_TABLET | ORAL | Status: AC | PRN
Start: 2009-09-16 — End: 2009-09-19

## 2009-09-16 MED ORDER — PREDNISONE 20 MG TAB
20 mg | ORAL_TABLET | Freq: Every day | ORAL | Status: AC
Start: 2009-09-16 — End: 2009-09-21

## 2009-09-16 MED ORDER — PROMETHAZINE 25 MG TAB
25 mg | ORAL_TABLET | Freq: Four times a day (QID) | ORAL | Status: DC | PRN
Start: 2009-09-16 — End: 2014-06-09

## 2009-09-16 MED ADMIN — predniSONE (DELTASONE) tablet 60 mg: ORAL | @ 05:00:00 | NDC 00054001820

## 2009-09-16 MED ADMIN — morphine injection 4 mg: INTRAVENOUS | NDC 00409125830

## 2009-09-16 MED ADMIN — ioversol (OPTIRAY) 350 mg/mL contrast solution 125 mL: INTRAVENOUS | @ 04:00:00 | NDC 00019133316

## 2009-09-16 MED ADMIN — ondansetron (ZOFRAN) injection 4 mg: INTRAVENOUS | @ 05:00:00 | NDC 00143989105

## 2009-09-16 MED ADMIN — ondansetron (ZOFRAN) injection 4 mg: INTRAVENOUS | NDC 00143989105

## 2009-09-16 MED ADMIN — HYDROmorphone (PF) (DILAUDID) injection 1 mg: INTRAVENOUS | @ 05:00:00 | NDC 00409255201

## 2009-09-16 MED ADMIN — diatrizoate meglumine & sodium (MD-GASTROVIEW) 66-10 % contrast solution 30 mL: ORAL | @ 01:00:00 | NDC 00270044535

## 2009-09-16 MED ADMIN — HYDROmorphone (PF) (DILAUDID) injection 0.5 mg: INTRAVENOUS | @ 01:00:00 | NDC 00409255201

## 2009-09-16 MED ADMIN — 0.9% sodium chloride infusion: INTRAVENOUS | NDC 00409798309

## 2009-09-16 NOTE — ED Notes (Signed)
Pt given discharge instructions and prescriptions; verbalized understanding; e-signed out; ambulated out of er with friend at side

## 2009-09-18 NOTE — Progress Notes (Signed)
[facility] [address], [address]   [place], [place] [address]  Phone: [phone]   Fax: [phone]    OUTPATIENT ORTHOPAEDIC PHYSICAL THERAPY  []        Initial Assessment     []        Daily Note      []        Progress Report     [x]        Recertification    []        Discharge  []        Patient's Fall Risk Score: 5 (5 or greater = High Risk)    NAME/AGE/GENDER: Christine Castillo is a 29 y.o. female    DATE: 09/18/2009    REFERRING PHYSICIAN: Dr. Kathie Rhodes  Return Physician Appointment: Nothing scheduled  MEDICAL/REFERRING DIAGNOSIS: Left hip pain  Treatment Diagnosis: Pain in joint, pelvic region and thigh  DATE OF ONSET: 05/26/09  PRIOR LEVEL OF FUNCTION: [place]  PRECAUTIONS/ALLERGIES: Penicillin    SUBJECTIVE     Present Symptoms: At initial evaluation: Pt states that she has some days that are better than other. When she comes home from work she can barely walk. Pt states pain is in the left hip and shoots down to left knee. Occasional sore ankle as pt seems to think its due to the way she walks. Pain level at present 3/10, at worst 8/10. Pain is aggravated with all movement and better with loritab. Pt states that upon falling she hurt her shoulder and wrist, however she has had x-rays and they do not hurt anymore.      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      Work/Activity History: Works at a [employer]    OBJECTIVE      No change to note as pt was only seen once at initial evaluation.   ASSESSMENT   Pt is a 29 year old female who presents with left lower extremity weakness and pain in left hip limiting all functional mobility that involves weight bearing. Pt was only seen for initial evaluation ,did not return , discharge at this time.   **This section established at most recent assessment**  PROBLEM LIST (Impairments causing functional limitations):  1.  Pain in left hip  2.  Decreased strength in left hip  3.  Tenderness in left hip  4.  Decreased independence with HEP  GOALS: (Goals have been discussed and agreed upon with patient.)  SHORT-TERM FUNCTIONAL GOALS: Time Frame: 2 weeks  1.  Pt to report a decrease in pain down to 3-5/10 after a long day of work.  2.  Pt to demonstrate 1/2 grade increase in left lower extremity strength to allow for symmetrical gait.   3.  Pt to report a decrease in tenderness in pelvic and hip region allowing for manual therapy if indicated.  4.  Pt to achieve independence with HEP to maintain goal achieved.  DISCHARGE GOALS: Time Frame: 4 weeks  1.  Pt to tolerate 4 hours of weight bearing without  an increase in pain levels in left hip.  2.  Pt to tolerate A/PROM to left hip without increase in symptoms indicating restored joint mobility.   3.  Pt to return to all daily activities without pain/difficulty.   REHABILITATION POTENTIAL FOR STATED GOALS: Good     Unable to state if goals were met, discharge at this time.   Jannet Mantis, PT

## 2009-09-28 MED ADMIN — gadopentetate dimeglumine (MAGNEVIST) 10 mmol/20 mL (469.01 mg/mL) contrast syringe 20 mL: INTRAVENOUS | @ 13:00:00 | NDC 50419018838

## 2009-09-28 MED ADMIN — ioversol (OPTIRAY) 320 mg/mL contrast injection 50 mL: INTRAVENOUS | @ 13:00:00 | NDC 00019132306

## 2009-09-28 NOTE — Progress Notes (Signed)
Verbal nad written discharge instructions given to pt. Questions answered.

## 2014-06-09 ENCOUNTER — Ambulatory Visit: Admit: 2014-06-09 | Discharge: 2014-06-09 | Attending: Family | Primary: Family Medicine

## 2014-06-09 DIAGNOSIS — Z021 Encounter for pre-employment examination: Secondary | ICD-10-CM

## 2014-06-09 NOTE — Progress Notes (Signed)
Christine Castillo is a 34 y.o. female who is seen for evaluation of   Chief Complaint   Patient presents with   ??? Drug Screen   ??? Employment Physical       HPI:   34 yo female here for pre-employment physical. 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 She is aware of her job responsibilities and feels she can complete them without difficulty. Past medical hx revive wed and (+) for Crohn's disease, no recent flares. Also has exercise induced asthma with no recent exacerbations.        History:  Past Medical History   Diagnosis Date   ??? Asthma      exercise induced   ??? 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 Crohn's disease    ??? Gastrointestinal disorder      crohn's       Past Surgical History   Procedure Laterality Date   ??? Hx orthopaedic       r pinkey   ??? Pr abdomen surgery proc unlisted       uterus  blockage removed, colon rescec.   ??? Hx cholecystectomy         History   Substance Use Topics   ??? Smoking status: Current Every Day Smoker -- 1.00 packs/day   ??? Smokeless tobacco: Never Used   ??? Alcohol Use: 0.6 - 1.2 oz/week     1-2 Standard drinks or equivalent per week       Allergies:   Allergies   Allergen Reactions   ??? Penicillin G Hives       Vitals:  BP 132/91 mmHg   Pulse 80   Temp(Src) 98.8 ??F (37.1 ??C) (Tympanic)   Resp 16   Ht 5' 5.5" (1.664 m)   Wt 198 lb (89.812 kg)   BMI 32.44 kg/m2   SpO2 98%    Review of Systems:  Review of Systems   Constitutional: Negative for fever, chills, weight loss, malaise/fatigue and diaphoresis.   HENT: Negative for congestion, ear discharge, ear pain, hearing loss, nosebleeds, sore throat and tinnitus.    Eyes: Negative for blurred vision, double vision, photophobia, pain, discharge and redness.   Respiratory: Negative for cough, hemoptysis, sputum production, shortness of breath and wheezing.     Cardiovascular: Negative for chest pain, palpitations, orthopnea, claudication, leg swelling and PND.   Gastrointestinal: Negative for heartburn, nausea, vomiting, abdominal pain, diarrhea, constipation, blood in stool and melena.   Genitourinary: Negative for dysuria, urgency, frequency, hematuria and flank pain.   Musculoskeletal: Negative for myalgias, back pain, joint pain, falls and neck pain.   Skin: Negative for itching and rash.   Neurological: Negative for dizziness, tingling, tremors, sensory change, speech change, focal weakness, seizures, loss of consciousness, weakness and headaches.   Endo/Heme/Allergies: Negative for environmental allergies and polydipsia. Does not bruise/bleed easily.   Psychiatric/Behavioral: Negative for depression, suicidal ideas, hallucinations, memory loss and substance abuse. The patient is not nervous/anxious and does not have insomnia.        Physical Exam:  Physical Exam   Constitutional: She is oriented to person, place, and time and well-developed, well-nourished, and in no distress.   HENT:   Head: Normocephalic and atraumatic.   Right Ear: External ear normal.   Left Ear: External ear normal.   Nose: Nose normal.  Mouth/Throat: Oropharynx is clear and moist. No oropharyngeal exudate.   Eyes: Conjunctivae and EOM are normal. Pupils are equal, round, and reactive to light. Right eye exhibits no discharge. Left eye exhibits no discharge.   Neck: Normal range of motion. Neck supple. No JVD present. No tracheal deviation present. No thyromegaly present.   Cardiovascular: Normal rate, regular rhythm, normal heart sounds and intact distal pulses.  Exam reveals no gallop and no friction rub.    No murmur heard.  Pulmonary/Chest: Effort normal and breath sounds normal. No respiratory distress. She has no wheezes. She has no rales. She exhibits no tenderness.   Abdominal: Soft. Bowel sounds are normal. She exhibits no distension and  no mass. There is no tenderness. There is no rebound and no guarding.   Musculoskeletal: Normal range of motion. She exhibits no edema or tenderness.   Lymphadenopathy:     She has no cervical adenopathy.   Neurological: She is alert and oriented to person, place, and time. She has normal reflexes. No cranial nerve deficit. She exhibits normal muscle tone. Gait normal. Coordination normal. GCS score is 15.   Skin: Skin is warm and dry. No rash noted. She is not diaphoretic. No erythema. No pallor.   Psychiatric: Mood, memory, affect and judgment normal.   Vitals reviewed.      Assessment:     ICD-10-CM ICD-9-CM    1. Physical exam, pre-employment Z02.1 V70.5        Plan:  No orders of the defined types were placed in this encounter.       1). Physical examination (pre-employment) - Completed ROS and exam. Per patient feels she can meet the expectations of her job. Pending negative urine drug screen, cleared for employment.    Elyn Aquas, NP

## 2014-06-09 NOTE — Patient Instructions (Signed)
Well Visit, Ages 18 to 50: Care Instructions  Your Care Instructions  Physical exams can help you stay healthy. Your doctor has checked your overall health and may have suggested ways to take good care of yourself. He or she also may have recommended tests. At home, you can help prevent illness with healthy eating, regular exercise, and other steps.  Follow-up care is a key part of your treatment and safety. Be sure to make and go to all appointments, and call your doctor if you are having problems. It's also a good idea to know your test results and keep a list of the medicines you take.  How can you care for yourself at home?  ?? Reach and stay at a healthy weight. This will lower your risk for many problems, such as obesity, diabetes, heart disease, and high blood pressure.  ?? Get at least 30 minutes of physical activity on most days of the week. Walking is a good choice. You also may want to do other activities, such as running, swimming, cycling, or playing tennis or team sports. Discuss any changes in your exercise program with your doctor.  ?? Do not smoke or allow others to smoke around you. If you need help quitting, talk to your doctor about stop-smoking programs and medicines. These can increase your chances of quitting for good.  ?? Talk to your doctor about whether you have any risk factors for sexually transmitted infections (STIs). Having one sex partner (who does not have STIs and does not have sex with anyone else) is a good way to avoid these infections.  ?? Use birth control if you do not want to have children at this time. Talk with your doctor about the choices available and what might be best for you.  ?? Always wear sunscreen on exposed skin. Make sure the sunscreen blocks ultraviolet rays (both UVA and UVB) and has a sun protection factor (SPF) of at least 15. Use it every day, even when it is cloudy. Some doctors may recommend a higher SPF, such as 30.   ?? See a dentist one or two times a year for checkups and to have your teeth cleaned.  ?? Wear a seat belt in the car.  ?? Drink alcohol in moderation, if at all. That means no more than 2 drinks a day for men and 1 drink a day for women.  Follow your doctor's advice about when to have certain tests. These tests can spot problems early.  For everyone  ?? Cholesterol. Have the fat (cholesterol) in your blood tested after age 20. Your doctor will tell you how often to have this done based on your age, family history, or other things that can increase your risk for heart disease.  ?? Blood pressure. Experts suggest that healthy adults with normal blood pressure (119/79 mm Hg or below) have their blood pressure checked at least every 1 to 2 years. This can be done during a routine doctor visit. If you have slightly higher or high blood pressure, your doctor will suggest more frequent tests.  ?? Vision. Talk with your doctor about how often to have a glaucoma test.  ?? Diabetes. Ask your doctor whether you should have tests for diabetes.  ?? Colon cancer. Have a test for colon cancer at age 50. You may have one of several tests. If you are younger than 50, you may need a test earlier if you have any risk factors. Risk factors include whether you already had   a precancerous polyp removed from your colon or whether your parent, brother, sister, or child has had colon cancer.  For women  ?? Breast exam and mammogram. Talk to your doctor about when you should have a clinical breast exam and a mammogram. Medical experts differ on whether and how often women under 50 should have these tests. Your doctor can help you decide what is right for you.  ?? Pap test and pelvic exam. Begin Pap tests at age 21. A Pap test is the best way to find cervical cancer. The test often is part of a pelvic exam. Ask how often to have this test.  ?? Tests for sexually transmitted infections (STIs). Ask whether you should  have tests for STIs. You may be at risk if you have sex with more than one person, especially if your partners do not wear condoms.  For men  ?? Tests for sexually transmitted infections (STIs). Ask whether you should have tests for STIs. You may be at risk if you have sex with more than one person, especially if you do not wear a condom.  ?? Testicular cancer exam. Ask your doctor whether you should check your testicles regularly.  ?? Prostate exam. Talk to your doctor about whether you should have a blood test (called a PSA test) for prostate cancer. Experts differ on whether and when men should have this test. Some experts suggest it if you are older than 45 and are African-American or have a father or brother who got prostate cancer when he was younger than 65.  When should you call for help?  Watch closely for changes in your health, and be sure to contact your doctor if you have any problems or symptoms that concern you.   Where can you learn more?   Go to http://www.healthwise.net/[facility]  Enter P072 in the search box to learn more about "Well Visit, Ages 18 to 50: Care Instructions."   ?? 2006-2016 Healthwise, Incorporated. Care instructions adapted under license by [facility] (which disclaims liability or warranty for this information). This care instruction is for use with your licensed healthcare professional. If you have questions about a medical condition or this instruction, always ask your healthcare professional. Healthwise, Incorporated disclaims any warranty or liability for your use of this information.  Content Version: 10.8.513193; Current as of: March 29, 2013

## 2014-10-22 ENCOUNTER — Emergency Department: Admit: 2014-10-22 | Payer: MEDICAID | Primary: Family Medicine

## 2014-10-22 ENCOUNTER — Inpatient Hospital Stay
Admit: 2014-10-22 | Discharge: 2014-10-22 | Disposition: A | Discharge status: Home / Self Care | Payer: MEDICAID | Attending: Emergency Medicine

## 2014-10-22 DIAGNOSIS — K920 Hematemesis: Secondary | ICD-10-CM

## 2014-10-22 LAB — CBC WITH AUTOMATED DIFF
ABS. BASOPHILS: 0.1 10*3/uL (ref 0.0–0.2)
ABS. EOSINOPHILS: 0.4 10*3/uL (ref 0.0–0.8)
ABS. IMM. GRANS.: 0.1 10*3/uL (ref 0.0–0.5)
ABS. LYMPHOCYTES: 4.8 10*3/uL — ABNORMAL HIGH (ref 0.5–4.6)
ABS. MONOCYTES: 0.9 10*3/uL (ref 0.1–1.3)
ABS. NEUTROPHILS: 7.2 10*3/uL (ref 1.7–8.2)
BASOPHILS: 0 % (ref 0.0–2.0)
EOSINOPHILS: 3 % (ref 0.5–7.8)
HCT: 39.2 % (ref 35.8–46.3)
HGB: 13 g/dL (ref 11.7–15.4)
IMMATURE GRANULOCYTES: 0.5 % (ref 0.0–5.0)
LYMPHOCYTES: 36 % (ref 13–44)
MCH: 29.6 PG (ref 26.1–32.9)
MCHC: 33.2 g/dL (ref 31.4–35.0)
MCV: 89.3 FL (ref 79.6–97.8)
MONOCYTES: 7 % (ref 4.0–12.0)
MPV: 10.5 FL — ABNORMAL LOW (ref 10.8–14.1)
NEUTROPHILS: 54 % (ref 43–78)
PLATELET: 335 10*3/uL (ref 150–450)
RBC: 4.39 M/uL (ref 4.05–5.25)
RDW: 14.2 % (ref 11.9–14.6)
WBC: 13.4 10*3/uL — ABNORMAL HIGH (ref 4.3–11.1)

## 2014-10-22 LAB — METABOLIC PANEL, COMPREHENSIVE
A-G Ratio: 0.9 — ABNORMAL LOW (ref 1.2–3.5)
ALT (SGPT): 43 U/L (ref 12–65)
AST (SGOT): 23 U/L (ref 15–37)
Albumin: 3.6 g/dL (ref 3.5–5.0)
Alk. phosphatase: 103 U/L (ref 50–136)
Anion gap: 8 mmol/L (ref 7–16)
BUN: 11 MG/DL (ref 6–23)
Bilirubin, total: 0.3 MG/DL (ref 0.2–1.1)
CO2: 27 mmol/L (ref 21–32)
Calcium: 9.1 MG/DL (ref 8.3–10.4)
Chloride: 104 mmol/L (ref 98–107)
Creatinine: 0.58 MG/DL — ABNORMAL LOW (ref 0.6–1.0)
GFR est AA: >60 mL/min/{1.73_m2} (ref >60)
GFR est non-AA: >60 mL/min/{1.73_m2} (ref >60)
Globulin: 3.8 g/dL — ABNORMAL HIGH (ref 2.3–3.5)
Glucose: 108 mg/dL — ABNORMAL HIGH (ref 65–100)
Potassium: 3.8 mmol/L (ref 3.5–5.1)
Protein, total: 7.4 g/dL (ref 6.3–8.2)
Sodium: 139 mmol/L (ref 136–145)

## 2014-10-22 LAB — HCG URINE, QL. - POC: Pregnancy test,urine (POC): NEGATIVE

## 2014-10-22 LAB — URINE MICROSCOPIC: Bacteria: 0 /hpf

## 2014-10-22 MED ORDER — SODIUM CHLORIDE 0.9 % IJ SYRG
INTRAMUSCULAR | Status: DC | PRN
Start: 2014-10-22 — End: 2014-10-22

## 2014-10-22 MED ORDER — SODIUM CHLORIDE 0.9 % IJ SYRG
Freq: Three times a day (TID) | INTRAMUSCULAR | Status: DC
Start: 2014-10-22 — End: 2014-10-22

## 2014-10-22 MED ORDER — PANTOPRAZOLE 40 MG TAB, DELAYED RELEASE
40 mg | ORAL_TABLET | Freq: Every day | ORAL | 0 refills | Status: AC
Start: 2014-10-22 — End: 2014-11-11

## 2014-10-22 MED ORDER — SUCRALFATE 1 GRAM TAB
1 gram | ORAL_TABLET | Freq: Four times a day (QID) | ORAL | 0 refills | Status: AC
Start: 2014-10-22 — End: ?

## 2014-10-22 NOTE — ED Notes (Signed)
I have reviewed discharge instructions with the patient.  The patient verbalized understanding.

## 2014-10-22 NOTE — ED Notes (Signed)
One side rail up, bed in low position,bed brakes on, call light within reach.  Pt resting in bed,resp easy,VSS,family at bedside, belongs in reach. Offered restroom and change in position.

## 2014-10-22 NOTE — ED Triage Notes (Signed)
Pt states having red/ bloody stools and emesis since yesterday. hx of chron's.

## 2014-10-22 NOTE — ED Provider Notes (Signed)
HPI Comments: 34 year old female complaining of bright red blood in emesis.  Patient has a history of Crohn's disease also had rectal bleeding in the past but complaints of hematemesis this morning.  She states she's had 4 episodes in the last 24 hours.  Patient does drink alcohol and has had gastritis in the past. Patient states that she's not seen her after all this for several years.   Review of visit history she does have several visits to multiple hospitals for abdominal pain and other issues.    Patient is a 34 y.o. female presenting with anal bleeding. The history is provided by the patient.   Rectal Bleeding    This is a recurrent problem. The current episode started 12 to 24 hours ago. Associated symptoms include abdominal pain, nausea and vomiting. Risk factors include obesity. She has tried nothing for the symptoms. Her past medical history is significant for irritable bowel syndrome.        Past Medical History:   Diagnosis Date   ??? Asthma      exercise induced   ??? Crohn's disease    ??? Gastrointestinal disorder      crohn's   ??? 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 Hx orthopaedic       r pinkey   ??? Pr abdomen surgery proc unlisted       uterus  blockage removed, colon rescec.   ??? Hx cholecystectomy           Family History:   Problem Relation Age of Onset   ??? Diabetes Mother        Social History     Social History   ??? Marital status: SINGLE     Spouse name: N/A   ??? Number of children: 2   ??? Years of education: N/A     Occupational History   ??? Not on file.     Social History Main Topics   ??? Smoking status: Current Every Day Smoker     Packs/day: 1.00   ??? Smokeless tobacco: Never Used   ??? Alcohol use 0.6 - 1.2 oz/week     1 - 2 Standard drinks or equivalent per week   ??? Drug use: No   ??? Sexual activity: Yes     Other Topics Concern   ??? Not on file      Social History Narrative         ALLERGIES: Penicillin g    Review of Systems   Constitutional: Negative.  Negative for activity change.   HENT: Negative.    Eyes: Negative.    Respiratory: Negative.    Cardiovascular: Negative.    Gastrointestinal: Positive for abdominal pain, anal bleeding, nausea and vomiting.   Genitourinary: Negative.    Musculoskeletal: Negative.    Skin: Negative.    Neurological: Negative.    Psychiatric/Behavioral: Negative.    All other systems reviewed and are negative.      Vitals:    10/22/14 0846   BP: 131/89   Pulse: 88   Resp: 16   Temp: 98.7 ??F (37.1 ??C)   SpO2: 98%   Weight: 81.6 kg (180 lb)   Height:  (1.651 m)            Physical Exam   Constitutional: She  is oriented to person, place, and time. She appears well-developed and well-nourished. No distress.   HENT:   Head: Normocephalic and atraumatic.   Right Ear: External ear normal.   Left Ear: External ear normal.   Nose: Nose normal.   Mouth/Throat: Oropharynx is clear and moist. No oropharyngeal exudate.   Eyes: Conjunctivae and EOM are normal. Pupils are equal, round, and reactive to light. Right eye exhibits no discharge. Left eye exhibits no discharge. No scleral icterus.   Neck: Normal range of motion. Neck supple. No JVD present. No tracheal deviation present.   Cardiovascular: Normal rate, regular rhythm and intact distal pulses.    Pulmonary/Chest: Effort normal and breath sounds normal. No stridor. No respiratory distress. She has no wheezes. She exhibits no tenderness.   Abdominal: Soft. Bowel sounds are normal. She exhibits no distension and no mass. There is no tenderness.   Musculoskeletal: Normal range of motion. She exhibits no edema or tenderness.   Neurological: She is alert and oriented to person, place, and time. No cranial nerve deficit.   Skin: Skin is warm and dry. No rash noted. She is not diaphoretic. No erythema. No pallor.   Psychiatric: She has a normal mood and affect. Her behavior is normal.  Thought content normal.   Nursing note and vitals reviewed.       MDM  Number of Diagnoses or Management Options  Diagnosis management comments: Serial examination of abdomen shows no acute abdominal findings.  Patient states she's been having emesis uncontrollably however she does not have any vomiting in the ED.  Patient is on her phone most of her stay and appears in no distress.       Amount and/or Complexity of Data Reviewed  Clinical lab tests: ordered and reviewed  Tests in the radiology section of CPT??: ordered and reviewed  Tests in the medicine section of CPT??: ordered and reviewed  Decide to obtain previous medical records or to obtain history from someone other than the patient: yes    Risk of Complications, Morbidity, and/or Mortality  Presenting problems: high  Diagnostic procedures: high  Management options: high      ED Course       Procedures

## 2019-07-18 ENCOUNTER — Encounter (HOSPITAL_COMMUNITY): Payer: Self-pay

## 2019-07-18 ENCOUNTER — Other Ambulatory Visit: Payer: Self-pay

## 2019-07-18 ENCOUNTER — Emergency Department (HOSPITAL_COMMUNITY)
Admission: EM | Admit: 2019-07-18 | Discharge: 2019-07-18 | Disposition: A | Discharge status: Home / Self Care | Payer: Self-pay | Attending: Emergency Medicine | Admitting: Emergency Medicine

## 2019-07-18 ENCOUNTER — Emergency Department (HOSPITAL_COMMUNITY): Payer: Self-pay

## 2019-07-18 DIAGNOSIS — K5090 Crohn's disease, unspecified, without complications: Secondary | ICD-10-CM | POA: Insufficient documentation

## 2019-07-18 DIAGNOSIS — K529 Noninfective gastroenteritis and colitis, unspecified: Secondary | ICD-10-CM

## 2019-07-18 HISTORY — DX: Crohn's disease, unspecified, without complications: K50.90

## 2019-07-18 LAB — CBC
HCT: 43.1 % (ref 36.0–46.0)
Hemoglobin: 14 g/dL (ref 12.0–15.0)
MCH: 30.4 pg (ref 26.0–34.0)
MCHC: 32.5 g/dL (ref 30.0–36.0)
MCV: 93.7 fL (ref 80.0–100.0)
Platelets: 359 10*3/uL (ref 150–400)
RBC: 4.6 MIL/uL (ref 3.87–5.11)
RDW: 12.7 % (ref 11.5–15.5)
WBC: 14.3 10*3/uL — ABNORMAL HIGH (ref 4.0–10.5)
nRBC: 0 % (ref 0.0–0.2)

## 2019-07-18 LAB — COMPREHENSIVE METABOLIC PANEL
ALT: 20 U/L (ref 0–44)
AST: 19 U/L (ref 15–41)
Albumin: 3.8 g/dL (ref 3.5–5.0)
Alkaline Phosphatase: 87 U/L (ref 38–126)
Anion gap: 12 (ref 5–15)
BUN: 7 mg/dL (ref 6–20)
CO2: 20 mmol/L — ABNORMAL LOW (ref 22–32)
Calcium: 8.8 mg/dL — ABNORMAL LOW (ref 8.9–10.3)
Chloride: 107 mmol/L (ref 98–111)
Creatinine, Ser: 0.73 mg/dL (ref 0.44–1.00)
GFR calc Af Amer: >60 mL/min (ref >60)
GFR calc non Af Amer: >60 mL/min (ref >60)
Glucose, Bld: 145 mg/dL — ABNORMAL HIGH (ref 70–99)
Potassium: 3.4 mmol/L — ABNORMAL LOW (ref 3.5–5.1)
Sodium: 139 mmol/L (ref 135–145)
Total Bilirubin: 0.3 mg/dL (ref 0.3–1.2)
Total Protein: 6.8 g/dL (ref 6.5–8.1)

## 2019-07-18 LAB — LIPASE, BLOOD: Lipase: 43 U/L (ref 11–51)

## 2019-07-18 LAB — URINALYSIS, ROUTINE W REFLEX MICROSCOPIC
Bilirubin Urine: NEGATIVE
Glucose, UA: NEGATIVE mg/dL
Hgb urine dipstick: NEGATIVE
Ketones, ur: NEGATIVE mg/dL
Leukocytes,Ua: NEGATIVE
Nitrite: NEGATIVE
Protein, ur: NEGATIVE mg/dL
Specific Gravity, Urine: 1.003 — ABNORMAL LOW (ref 1.005–1.030)
pH: 6 (ref 5.0–8.0)

## 2019-07-18 LAB — I-STAT BETA HCG BLOOD, ED (MC, WL, AP ONLY): I-stat hCG, quantitative: <5 m[IU]/mL (ref <5)

## 2019-07-18 LAB — C-REACTIVE PROTEIN: CRP: 0.6 mg/dL (ref <1.0)

## 2019-07-18 LAB — SEDIMENTATION RATE: Sed Rate: 13 mm/hr (ref 0–22)

## 2019-07-18 MED ORDER — FLUOXETINE HCL 40 MG PO CAPS: 40.0000 mg | ORAL_CAPSULE | Freq: Every day | ORAL | 0 refills | Status: DC

## 2019-07-18 MED ORDER — DICYCLOMINE HCL 20 MG PO TABS
20.0000 mg | ORAL_TABLET | Freq: Two times a day (BID) | ORAL | 0 refills | Status: DC
Start: 2019-07-18 — End: 2019-07-18

## 2019-07-18 MED ORDER — PREDNISONE 20 MG PO TABS
40.0000 mg | ORAL_TABLET | Freq: Every day | ORAL | 0 refills | Status: DC
Start: 2019-07-18 — End: 2019-07-18

## 2019-07-18 MED ORDER — IOHEXOL 9 MG/ML PO SOLN
500.0000 mL | ORAL | Status: AC
  Administered 2019-07-18 (×2): 500 mL via ORAL

## 2019-07-18 MED ORDER — PREDNISONE 20 MG PO TABS
60.0000 mg | ORAL_TABLET | ORAL | Status: AC
  Administered 2019-07-18: 60 mg via ORAL
  Filled 2019-07-18: qty 3

## 2019-07-18 MED ORDER — FENTANYL CITRATE (PF) 100 MCG/2ML IJ SOLN
25.0000 ug | Freq: Once | INTRAMUSCULAR | Status: AC
  Administered 2019-07-18: 25 ug via INTRAVENOUS
  Filled 2019-07-18: qty 2

## 2019-07-18 MED ORDER — SODIUM CHLORIDE 0.9% FLUSH: 3.0000 mL | Freq: Once | INTRAVENOUS | Status: DC

## 2019-07-18 MED ORDER — DICYCLOMINE HCL 10 MG/ML IM SOLN
20.0000 mg | Freq: Once | INTRAMUSCULAR | Status: AC
  Administered 2019-07-18: 20 mg via INTRAMUSCULAR
  Filled 2019-07-18: qty 2

## 2019-07-18 MED ORDER — PREDNISONE 20 MG PO TABS
40.0000 mg | ORAL_TABLET | Freq: Every day | ORAL | 0 refills | Status: DC
Start: 2019-07-18 — End: 2019-10-06

## 2019-07-18 MED ORDER — OTEZLA 30 MG PO TABS: 1.0000 | ORAL_TABLET | Freq: Two times a day (BID) | ORAL | 0 refills | Status: AC

## 2019-07-18 MED ORDER — SODIUM CHLORIDE 0.9 % IV BOLUS
1000.0000 mL | Freq: Once | INTRAVENOUS | Status: AC
  Administered 2019-07-18: 1000 mL via INTRAVENOUS

## 2019-07-18 MED ORDER — ONDANSETRON HCL 4 MG/2ML IJ SOLN
4.0000 mg | Freq: Once | INTRAMUSCULAR | Status: AC
  Administered 2019-07-18: 4 mg via INTRAVENOUS
  Filled 2019-07-18: qty 2

## 2019-07-18 MED ORDER — DICYCLOMINE HCL 20 MG PO TABS: 20.0000 mg | ORAL_TABLET | Freq: Two times a day (BID) | ORAL | 0 refills | Status: AC

## 2019-07-18 MED ORDER — OTEZLA 30 MG PO TABS: 1.0000 | ORAL_TABLET | Freq: Two times a day (BID) | ORAL | 0 refills | Status: DC

## 2019-07-18 MED ORDER — FLUOXETINE HCL 40 MG PO CAPS: 40.0000 mg | ORAL_CAPSULE | Freq: Every day | ORAL | 0 refills | Status: AC

## 2019-07-18 NOTE — ED Provider Notes (Signed)
[facility] EMERGENCY DEPARTMENT Provider Note   CSN: [id number] Arrival date & time: 07/18/19  1357     History Chief Complaint  Patient presents with  . Abdominal Pain    Sarah Vasquez is a 39 y.o. female.  HPI Patient presents concern of abdominal pain, nausea, vomiting, constipation. She has a history of ulcerative colitis, does not have a local gastroenterologist, her primary care physician.  When she transition to this area about 1 week ago, and symptoms began soon thereafter. She also notes that she has run out of her medication, which includes methotrexate. Since onset about 4 days ago the patient has had persistent pain throughout the upper abdomen, severe, persistent, with associated anorexia, nausea. She has had multiple episodes of vomiting, no recent vomiting. No fever, though she notes that she does not typically have a febrile response to illness. Inability to take medication for pain relief is persistent.  Past Medical History:  Diagnosis Date  . Crohn disease ([place])     There are no problems to display for this patient.   History reviewed. No pertinent surgical history.   OB History   No obstetric history on file.     No family history on file.  Social History   Tobacco Use  . Smoking status: Not on file  Substance Use Topics  . Alcohol use: Not on file  . Drug use: Not on file    Home Medications Prior to Admission medications   Medication Sig Start Date End Date Taking? Authorizing Provider  Apremilast (OTEZLA) 30 MG TABS Take 1 tablet by mouth in the morning and at bedtime.   Yes [provider]  diphenhydrAMINE (SOMINEX) 25 MG tablet Take 1 tablet by mouth daily as needed for itching.    Yes [provider]  FLUoxetine (PROZAC) 40 MG capsule Take 1 capsule by mouth daily.   Yes [provider]  methotrexate 250 MG/10ML injection Inject 25 mg into the muscle every 7 (seven) days.  06/05/19  06/04/20 Yes [provider]    Allergies    Morphine and related, Adalimumab, Azathioprine, and Infliximab  Review of Systems   Review of Systems  Constitutional:       Per HPI, otherwise negative  HENT:       Per HPI, otherwise negative  Respiratory:       Per HPI, otherwise negative  Cardiovascular:       Per HPI, otherwise negative  Gastrointestinal: Positive for abdominal pain, constipation, nausea and vomiting.  Endocrine:       Negative aside from HPI  Genitourinary:       Neg aside from HPI   Musculoskeletal:       Per HPI, otherwise negative  Skin: Negative.   Neurological: Negative for syncope.    Physical Exam Updated Vital Signs BP (!) 129/92   Pulse 71   Temp (!) 96.9 F (36.1 C) (Oral)   Resp 11   Ht 5' 5"  (1.651 m)   Wt 87.5 kg   SpO2 100%   BMI 32.12 kg/m   Physical Exam Vitals and nursing note reviewed.  Constitutional:      General: She is not in acute distress.    Appearance: She is well-developed.  HENT:     Head: Normocephalic and atraumatic.  Eyes:     Conjunctiva/sclera: Conjunctivae normal.  Cardiovascular:     Rate and Rhythm: Normal rate and regular rhythm.  Pulmonary:     Effort: Pulmonary effort is  normal. No respiratory distress.     Breath sounds: Normal breath sounds. No stridor.  Abdominal:     General: There is no distension.     Tenderness: There is generalized abdominal tenderness and tenderness in the right upper quadrant, epigastric area and left upper quadrant. There is guarding.  Skin:    General: Skin is warm and dry.  Neurological:     Mental Status: She is alert and oriented to person, place, and time.     Cranial Nerves: No cranial nerve deficit.      ED Results / Procedures / Treatments   Labs (all labs ordered are listed, but only abnormal results are displayed) Labs Reviewed  COMPREHENSIVE METABOLIC PANEL - Abnormal; Notable for the following components:      Result Value   Potassium 3.4 (*)     CO2 20 (*)    Glucose, Bld 145 (*)    Calcium 8.8 (*)    All other components within normal limits  CBC - Abnormal; Notable for the following components:   WBC 14.3 (*)    All other components within normal limits  URINALYSIS, ROUTINE W REFLEX MICROSCOPIC - Abnormal; Notable for the following components:   Color, Urine STRAW (*)    Specific Gravity, Urine 1.003 (*)    All other components within normal limits  LIPASE, BLOOD  SEDIMENTATION RATE  C-REACTIVE PROTEIN  I-STAT BETA HCG BLOOD, ED ([facility], [facility], [facility] ONLY)    EKG None  Radiology CT ABDOMEN PELVIS WO CONTRAST  Result Date: 07/18/2019 CLINICAL DATA:  Crohn's flare up x5 days.  Abdominal pain. EXAM: CT ABDOMEN AND PELVIS WITHOUT CONTRAST TECHNIQUE: Multidetector CT imaging of the abdomen and pelvis was performed following the standard protocol without IV contrast. COMPARISON:  None. FINDINGS: Lower chest: The lung bases are clear. The heart size is normal. Hepatobiliary: The liver is normal. Status post cholecystectomy.There is no biliary ductal dilation. Pancreas: Normal contours without ductal dilatation. No peripancreatic fluid collection. Spleen: Unremarkable. Adrenals/Urinary Tract: --Adrenal glands: Unremarkable. --Right kidney/ureter: No hydronephrosis or radiopaque kidney stones. --Left kidney/ureter: The left kidney is absent. --Urinary bladder: Unremarkable. Stomach/Bowel: --Stomach/Duodenum: No hiatal hernia or other gastric abnormality. Normal duodenal course and caliber. --Small bowel: The patient appears to be status post ileocecectomy. There is a patent anastomosis in the right lower quadrant. There is no evidence for an obstruction. There may be some mild wall thickening of the neoterminal ileum (coronal series 6, image 66). --Colon: May be some mild wall thickening of the ascending colon and hepatic flexure. --Appendix: Surgically absent. Vascular/Lymphatic: Normal course and caliber of the major abdominal vessels. --No  retroperitoneal lymphadenopathy. --No mesenteric lymphadenopathy. --No pelvic or inguinal lymphadenopathy. Reproductive: There is a v-shaped configuration of the uterus. Other: No ascites or free air. The abdominal wall is normal. Musculoskeletal. No acute displaced fractures. IMPRESSION: 1. Status post ileocecectomy with patent anastomosis in the right lower quadrant. No evidence for an obstruction. 2. Possible mild wall thickening involving the neoterminal ileum and ascending colon which can indicate active Crohn's disease. 3. Absent gallbladder.  Absent left kidney. 4. 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 Electronically Signed   By: Constance Holster M.D.   On: 07/18/2019 21:39    Procedures Procedures (including critical care time)  Medications Ordered in ED Medications  sodium chloride flush (NS) 0.9 % injection 3 mL (3 mLs Intravenous Not Given 07/18/19 1659)  fentaNYL (SUBLIMAZE) injection 25 mcg (25 mcg Intravenous Given 07/18/19 1755)  sodium chloride 0.9 %  bolus 1,000 mL (0 mLs Intravenous Stopped 07/18/19 2116)  ondansetron (ZOFRAN) injection 4 mg (4 mg Intravenous Given 07/18/19 1756)  dicyclomine (BENTYL) injection 20 mg (20 mg Intramuscular Given 07/18/19 1756)  iohexol (OMNIPAQUE) 9 MG/ML oral solution 500 mL (500 mLs Oral Contrast Given 07/18/19 2115)  fentaNYL (SUBLIMAZE) injection 25 mcg (25 mcg Intravenous Given 07/18/19 2114)    ED Course  I have reviewed the triage vital signs and the nursing notes.  Pertinent labs & imaging results that were available during my care of the patient were reviewed by me and considered in my medical decision making (see chart for details).  Young female with a history of ulcerative colitis presents with abdominal pain, nausea, vomiting.  She also has history of bowel obstruction. With differential including these 2 entities, as well as diverticulitis or other infectious pathology, patient had labs, CT ordered. For  symptom control patient received fentanyl, Zofran, fluids.  Reviewing the patient's medical record notes charts from [place] and [place] with multiple visits for ulcerative colitis.   10:39 PM Now after multiple doses of fentanyl the patient feels better.  Vital signs unremarkable. In addition, she has received Zofran, Bentyl, fluids. We have reviewed the CT findings, consistent with colitis flare. No evidence for SBO, diverticulitis. Have discussed her case with our local gastroenterology colleagues.  Given her reassuring CT, though there is evidence for recurrence of disease, she is started on appropriate medication, will follow up closely in the clinic.  MDM Number of Diagnoses or Management Options Colitis: established, worsening   Amount and/or Complexity of Data Reviewed Clinical lab tests: reviewed Tests in the radiology section of CPT: reviewed Tests in the medicine section of CPT: reviewed Decide to obtain previous medical records or to obtain history from someone other than the patient: yes Obtain history from someone other than the patient: yes Review and summarize past medical records: yes Discuss the patient with other providers: yes Independent visualization of images, tracings, or specimens: yes  Risk of Complications, Morbidity, and/or Mortality Presenting problems: high Diagnostic procedures: high Management options: high   Final Clinical Impression(s) / ED Diagnoses Final diagnoses:  None    Rx / DC Orders ED Discharge Orders    None       Carmin Muskrat, MD 07/18/19 2241

## 2019-07-18 NOTE — Discharge Instructions (Addendum)
As discussed, your CT scan has demonstrated evidence for a flare of ulcerative colitis.  In addition to your prescribed medication, please schedule follow-up with our gastroenterology team.  When you call the office, please ask for follow-up in the coming days, note that he has been seen by Dr. Vanita Panda in the emergency department and he spoke with Dr. Paulita Fujita about the need for follow-up.  Return here for concerning changes in your condition.

## 2019-07-18 NOTE — ED Triage Notes (Signed)
Patient complains of Crohns flare-up x 5 days. Complains of vomiting and constipation, report that her pain upper abdominal.

## 2019-07-18 NOTE — ED Notes (Signed)
Pt expressed that pain was increasing again.  Dr. Vanita Panda notified.

## 2019-10-05 ENCOUNTER — Encounter (HOSPITAL_COMMUNITY): Payer: Self-pay

## 2019-10-05 ENCOUNTER — Other Ambulatory Visit: Payer: Self-pay

## 2019-10-05 DIAGNOSIS — F17210 Nicotine dependence, cigarettes, uncomplicated: Secondary | ICD-10-CM | POA: Insufficient documentation

## 2019-10-05 DIAGNOSIS — R1084 Generalized abdominal pain: Secondary | ICD-10-CM | POA: Insufficient documentation

## 2019-10-05 DIAGNOSIS — K5080 Crohn's disease of both small and large intestine without complications: Secondary | ICD-10-CM | POA: Insufficient documentation

## 2019-10-05 DIAGNOSIS — R197 Diarrhea, unspecified: Secondary | ICD-10-CM | POA: Insufficient documentation

## 2019-10-05 LAB — I-STAT BETA HCG BLOOD, ED (MC, WL, AP ONLY): I-stat hCG, quantitative: <5 m[IU]/mL (ref <5)

## 2019-10-05 NOTE — ED Triage Notes (Signed)
Arrived POV from home patient reports  She is having a Chron's flare up. Patient reports extreme nausea, diarrhea with coffe ground stool, and LUQ and RLQ abdominal pain

## 2019-10-06 ENCOUNTER — Emergency Department (HOSPITAL_COMMUNITY)
Admission: EM | Admit: 2019-10-06 | Discharge: 2019-10-06 | Disposition: A | Discharge status: Home / Self Care | Payer: Self-pay | Attending: Emergency Medicine | Admitting: Emergency Medicine

## 2019-10-06 ENCOUNTER — Emergency Department (HOSPITAL_COMMUNITY): Payer: Self-pay

## 2019-10-06 DIAGNOSIS — K5080 Crohn's disease of both small and large intestine without complications: Secondary | ICD-10-CM

## 2019-10-06 LAB — URINALYSIS, ROUTINE W REFLEX MICROSCOPIC
Bilirubin Urine: NEGATIVE
Glucose, UA: NEGATIVE mg/dL
Hgb urine dipstick: NEGATIVE
Ketones, ur: NEGATIVE mg/dL
Leukocytes,Ua: NEGATIVE
Nitrite: NEGATIVE
Protein, ur: NEGATIVE mg/dL
Specific Gravity, Urine: 1.033 — ABNORMAL HIGH (ref 1.005–1.030)
pH: 5 (ref 5.0–8.0)

## 2019-10-06 LAB — COMPREHENSIVE METABOLIC PANEL
ALT: 21 U/L (ref 0–44)
AST: 20 U/L (ref 15–41)
Albumin: 4.9 g/dL (ref 3.5–5.0)
Alkaline Phosphatase: 93 U/L (ref 38–126)
Anion gap: 14 (ref 5–15)
BUN: 11 mg/dL (ref 6–20)
CO2: 21 mmol/L — ABNORMAL LOW (ref 22–32)
Calcium: 9.4 mg/dL (ref 8.9–10.3)
Chloride: 104 mmol/L (ref 98–111)
Creatinine, Ser: 0.85 mg/dL (ref 0.44–1.00)
GFR calc Af Amer: >60 mL/min (ref >60)
GFR calc non Af Amer: >60 mL/min (ref >60)
Glucose, Bld: 110 mg/dL — ABNORMAL HIGH (ref 70–99)
Potassium: 3.5 mmol/L (ref 3.5–5.1)
Sodium: 139 mmol/L (ref 135–145)
Total Bilirubin: 0.5 mg/dL (ref 0.3–1.2)
Total Protein: 8 g/dL (ref 6.5–8.1)

## 2019-10-06 LAB — CBC
HCT: 43.3 % (ref 36.0–46.0)
Hemoglobin: 14.6 g/dL (ref 12.0–15.0)
MCH: 30 pg (ref 26.0–34.0)
MCHC: 33.7 g/dL (ref 30.0–36.0)
MCV: 88.9 fL (ref 80.0–100.0)
Platelets: 336 10*3/uL (ref 150–400)
RBC: 4.87 MIL/uL (ref 3.87–5.11)
RDW: 12.7 % (ref 11.5–15.5)
WBC: 13.4 10*3/uL — ABNORMAL HIGH (ref 4.0–10.5)
nRBC: 0 % (ref 0.0–0.2)

## 2019-10-06 LAB — LIPASE, BLOOD: Lipase: 38 U/L (ref 11–51)

## 2019-10-06 MED ORDER — ACETAMINOPHEN 500 MG PO TABS
1000.0000 mg | ORAL_TABLET | Freq: Once | ORAL | Status: AC
  Administered 2019-10-06: 1000 mg via ORAL
  Filled 2019-10-06: qty 2

## 2019-10-06 MED ORDER — ONDANSETRON HCL 4 MG/2ML IJ SOLN
4.0000 mg | Freq: Once | INTRAMUSCULAR | Status: AC
  Administered 2019-10-06: 4 mg via INTRAVENOUS
  Filled 2019-10-06: qty 2

## 2019-10-06 MED ORDER — PREDNISONE 10 MG PO TABS: 40.0000 mg | ORAL_TABLET | Freq: Every day | ORAL | 0 refills | Status: AC

## 2019-10-06 MED ORDER — FLUTICASONE PROPIONATE 50 MCG/ACT NA SUSP: 2.0000 | Freq: Every day | NASAL | 0 refills | Status: AC

## 2019-10-06 MED ORDER — PREDNISONE 10 MG PO TABS: 40.0000 mg | ORAL_TABLET | Freq: Every day | ORAL | 0 refills | Status: DC

## 2019-10-06 MED ORDER — SODIUM CHLORIDE (PF) 0.9 % IJ SOLN
INTRAMUSCULAR | Status: AC
  Filled 2019-10-06: qty 50

## 2019-10-06 MED ORDER — FENTANYL CITRATE (PF) 100 MCG/2ML IJ SOLN
50.0000 ug | INTRAMUSCULAR | Status: DC | PRN
  Administered 2019-10-06: 50 ug via INTRAVENOUS
  Filled 2019-10-06: qty 2

## 2019-10-06 MED ORDER — IOHEXOL 300 MG/ML  SOLN
100.0000 mL | Freq: Once | INTRAMUSCULAR | Status: AC | PRN
  Administered 2019-10-06: 100 mL via INTRAVENOUS

## 2019-10-06 MED ORDER — FENTANYL CITRATE (PF) 100 MCG/2ML IJ SOLN
50.0000 ug | Freq: Once | INTRAMUSCULAR | Status: AC
  Administered 2019-10-06: 50 ug via INTRAVENOUS
  Filled 2019-10-06: qty 2

## 2019-10-06 MED ORDER — PREDNISONE 20 MG PO TABS
60.0000 mg | ORAL_TABLET | Freq: Once | ORAL | Status: AC
  Administered 2019-10-06: 60 mg via ORAL
  Filled 2019-10-06: qty 3

## 2019-10-06 MED ORDER — DICYCLOMINE HCL 10 MG PO CAPS
20.0000 mg | ORAL_CAPSULE | Freq: Once | ORAL | Status: AC
  Administered 2019-10-06: 20 mg via ORAL
  Filled 2019-10-06: qty 2

## 2019-10-06 MED ORDER — LACTATED RINGERS IV BOLUS
1000.0000 mL | Freq: Once | INTRAVENOUS | Status: AC
  Administered 2019-10-06: 1000 mL via INTRAVENOUS

## 2019-10-06 NOTE — ED Provider Notes (Signed)
[facility] DEPT Provider Note   CSN: [id number] Arrival date & time: 10/05/19  2311     History Chief Complaint  Patient presents with  . Chron's     Sarah Vasquez is a 39 y.o. female.  HPI  Patient is a 39 year old female with a past medical history of Crohn's diagnosed in 2007.  She has been seen by gastroenterology in [place] in the past she has moved to [place] within the past year and has not reestablished care with a gastroenterologist here.  She has between insurance plans and states that she is not able to afford follow-up with a gastroenterologist and is unable to forward her Bentyl, otezla -- her chron's regimen.   She states she has generalized severe abdominal pain that is achy, 10/10, progressively worsening over the past 8 days.  She states that she has a known trigger which is cough and flu medicines which she states reliably causes flares.  She states she had some congestion and sinus pressure a days ago and took these medicines which seem to improve her congestion symptoms however significantly worsened her abdominal pain.   She states that she has had dark brown diarrhea for the past 2 days but denies any black or tarry stools.  Denies any hematemesis.  She says her last bowel movement was yesterday evening.  She states that she has been passing gas.  No nausea or vomiting.    Past Medical History:  Diagnosis Date  . Crohn disease ([place])     There are no problems to display for this patient.   History reviewed. No pertinent surgical history.   OB History   No obstetric history on file.     No family history on file.  Social History   Tobacco Use  . Smoking status: Current Every Day Smoker    Packs/day: 0.25    Types: Cigarettes  . Smokeless tobacco: Never Used  Substance Use Topics  . Alcohol use: Yes  . Drug use: Not Currently    Home Medications Prior to Admission medications   Medication Sig Start Date  End Date Taking? Authorizing Provider  Apremilast (OTEZLA) 30 MG TABS Take 1 tablet (30 mg total) by mouth in the morning and at bedtime. 07/18/19   Carmin Muskrat, MD  dicyclomine (BENTYL) 20 MG tablet Take 1 tablet (20 mg total) by mouth 2 (two) times daily. 07/18/19   Carmin Muskrat, MD  diphenhydrAMINE (SOMINEX) 25 MG tablet Take 1 tablet by mouth daily as needed for itching.     [provider]  FLUoxetine (PROZAC) 40 MG capsule Take 1 capsule (40 mg total) by mouth daily. 07/18/19   Carmin Muskrat, MD  fluticasone [facility]) 50 MCG/ACT nasal spray Place 2 sprays into both nostrils daily for 14 days. 10/06/19 10/20/19  Tedd Sias, PA  methotrexate 250 MG/10ML injection Inject 25 mg into the muscle every 7 (seven) days.  06/05/19 06/04/20  [provider]  predniSONE (DELTASONE) 10 MG tablet Take 4 tablets (40 mg total) by mouth daily for 5 days. 10/06/19 10/11/19  Tedd Sias, PA    Allergies    Morphine and related, Adalimumab, Azathioprine, and Infliximab  Review of Systems   Review of Systems  Constitutional: Positive for fatigue. Negative for chills and fever.  HENT: Positive for congestion.   Eyes: Negative for pain.  Respiratory: Negative for cough and shortness of breath.   Cardiovascular: Negative for chest pain and leg swelling.  Gastrointestinal: Positive for abdominal  pain and diarrhea. Negative for blood in stool, nausea, rectal pain and vomiting.  Genitourinary: Negative for dysuria.  Musculoskeletal: Negative for myalgias.  Skin: Negative for rash.  Neurological: Negative for dizziness and headaches.    Physical Exam Updated Vital Signs BP (!) 150/109   Pulse 73   Temp 97.7 F (36.5 C) (Oral)   Resp 16   Ht 5' 5"  (1.651 m)   Wt 86.2 kg   SpO2 100%   BMI 31.62 kg/m   Physical Exam Vitals and nursing note reviewed.  Constitutional:      General: She is in acute distress.  HENT:     Head: Normocephalic and atraumatic.     Nose:  Nose normal.  Eyes:     General: No scleral icterus. Cardiovascular:     Rate and Rhythm: Normal rate and regular rhythm.     Pulses: Normal pulses.     Heart sounds: Normal heart sounds.  Pulmonary:     Effort: Pulmonary effort is normal. No respiratory distress.     Breath sounds: No wheezing.  Abdominal:     Palpations: Abdomen is soft.     Tenderness: There is generalized abdominal tenderness.     Comments: Generalized tenderness to palpation with voluntary guarding.  No involuntary guarding.  No rebound.  Pain with percussion of the left CVA but also with percussion of the left lateral flank.  Doubt that this is true CVA tenderness.  No right CVA tenderness.  Tenderness appears to be worse in left lower quadrant.  Genitourinary:    Comments: Deferred Musculoskeletal:     Cervical back: Normal range of motion.     Right lower leg: No edema.     Left lower leg: No edema.  Skin:    General: Skin is warm and dry.     Capillary Refill: Capillary refill takes less than 2 seconds.  Neurological:     Mental Status: She is alert. Mental status is at baseline.  Psychiatric:        Mood and Affect: Mood normal.        Behavior: Behavior normal.     ED Results / Procedures / Treatments   Labs (all labs ordered are listed, but only abnormal results are displayed) Labs Reviewed  COMPREHENSIVE METABOLIC PANEL - Abnormal; Notable for the following components:      Result Value   CO2 21 (*)    Glucose, Bld 110 (*)    All other components within normal limits  CBC - Abnormal; Notable for the following components:   WBC 13.4 (*)    All other components within normal limits  URINALYSIS, ROUTINE W REFLEX MICROSCOPIC - Abnormal; Notable for the following components:   Specific Gravity, Urine 1.033 (*)    All other components within normal limits  LIPASE, BLOOD  I-STAT BETA HCG BLOOD, ED ([facility], [facility], [facility] ONLY)    EKG None  Radiology CT ABDOMEN PELVIS W CONTRAST  Result Date:  10/06/2019 CLINICAL DATA:  LUQ and RLQ pain. Hx of crohns disease. EXAM: CT ABDOMEN AND PELVIS WITH CONTRAST TECHNIQUE: Multidetector CT imaging of the abdomen and pelvis was performed using the standard protocol following bolus administration of intravenous contrast. CONTRAST:  175m OMNIPAQUE IOHEXOL 300 MG/ML  SOLN COMPARISON:  CT abdomen pelvis 07/18/2019 FINDINGS: Lower chest: No acute abnormality. Hepatobiliary: No focal liver abnormality is seen. Status post cholecystectomy. No new biliary dilatation. Pancreas: Unremarkable. No pancreatic ductal dilatation or surrounding inflammatory changes. Spleen: Normal in size without focal  abnormality. Adrenals/Urinary Tract: Adrenal glands are unremarkable. Absent left kidney. The right kidney is normal in appearance. No hydronephrosis or renal calculi. Normal appearance of the urinary bladder. Stomach/Bowel: Stomach is within normal limits. Surgical changes in the right lower quadrant from prior ileocolic resection. There is focally dilated small bowel at the terminal ileum with mucosal hyperenhancement. No free air or adjacent fluid collection. Just proximal to this there is a short segment bowel wall thickening and narrowing. There is submucosal fat deposition in the ascending and transverse colon. No evidence of obstruction. Appendix is surgically absent. Vascular/Lymphatic: No significant vascular findings are present. No enlarged abdominal or pelvic lymph nodes. Reproductive: Uterus and bilateral adnexa are unremarkable. Other: No abdominal wall hernia or abnormality. No abdominopelvic ascites. Musculoskeletal: No acute or significant osseous findings. IMPRESSION: Focally dilated small bowel at the terminal ileum with mucosal hyperenhancement. Just proximal to this there is a short segment of bowel wall thickening and narrowing (possible skip lesion). No evidence of obstruction or perforation. Findings are concerning for active Crohn's disease. Electronically  Signed   By: Audie Pinto M.D.   On: 10/06/2019 13:47    Procedures Procedures (including critical care time)  Medications Ordered in ED Medications  sodium chloride (PF) 0.9 % injection (has no administration in time range)  fentaNYL (SUBLIMAZE) injection 50 mcg (50 mcg Intravenous Given 10/06/19 1454)  dicyclomine (BENTYL) capsule 20 mg (20 mg Oral Given 10/06/19 1248)  fentaNYL (SUBLIMAZE) injection 50 mcg (50 mcg Intravenous Given 10/06/19 1247)  ondansetron (ZOFRAN) injection 4 mg (4 mg Intravenous Given 10/06/19 1248)  lactated ringers bolus 1,000 mL (0 mLs Intravenous Stopped 10/06/19 1521)  predniSONE (DELTASONE) tablet 60 mg (60 mg Oral Given 10/06/19 1248)  iohexol (OMNIPAQUE) 300 MG/ML solution 100 mL (100 mLs Intravenous Contrast Given 10/06/19 1303)  acetaminophen (TYLENOL) tablet 1,000 mg (1,000 mg Oral Given 10/06/19 1454)    ED Course  I have reviewed the triage vital signs and the nursing notes.  Pertinent labs & imaging results that were available during my care of the patient were reviewed by me and considered in my medical decision making (see chart for details).  Patient 39 year old man female with a history of Crohn's disease not currently on any medication for this.  She has had symptoms consistent with a Crohn's flare she has had no history of abscess or SBO however she does have history of several abdominal surgeries.  She has had somewhat decreased bowel movements but is still passing flatus.  Will obtain CT imaging to rule out complication plan to discharge.  Physical exam notable for significant left lower quadrant tenderness palpation.  Clinical Course as of Oct 06 1835  Nancy Fetter Oct 06, 2019  1328 I-STAT hCG is negative suspicion for pregnancy.  Lipase is within normal limits doubt pancreatitis.  CMP is without any significant electrolyte abnormalities.  CBC with very mild elevation in white count-she had similar WBC slightly higher 2 months ago when seen for  similar symptoms.  Suspect Crohn's flare.  CT scan pending. Low suspicion for sequela of Crohn's flare however patient has poor follow-up and I feel compelled to rule out complications such as SBO or TMC.   [WF]  1330 Patient initially severely hypertensive and mildly tachycardic.  These are improving with IV fluids and analgesia.  Will p.o. challenge after CT scan.   [WF]    Clinical Course User Index [WF] Tedd Sias, PA   I discussed this case my attending physician Dr. Jeanell Sparrow who agrees  with my plan.   Patient reassessed.  Her abdominal exam is unchanged she is states that her pain is much improved however.  Will provide patient with an additional aliquot of fentanyl and discharged with prednisone.  She will use Tylenol at home for pain.  She is given return precautions.  She is also given ambulatory follow-up with gastroenterology. She was also given fluticasone for her nasal congestion.  MDM Rules/Calculators/A&P                          Final Clinical Impression(s) / ED Diagnoses Final diagnoses:  Crohn's disease of both small and large intestine without complication ([place])    Rx / DC Orders ED Discharge Orders         Ordered    Ambulatory referral to Gastroenterology       Comments: Crohn's with frequent worsening flares -- no GI here in [place] recently moved   10/06/19 1413    predniSONE (DELTASONE) 10 MG tablet  Daily,   Status:  Discontinued        10/06/19 1414    predniSONE (DELTASONE) 10 MG tablet  Daily        10/06/19 1420    fluticasone (FLONASE) 50 MCG/ACT nasal spray  Daily        10/06/19 1426           Pati Gallo [place], [place] 10/06/19 1837    Pattricia Boss, MD 10/07/19 1434

## 2019-10-06 NOTE — ED Notes (Signed)
NT first asked this RN to come talk to patient out in the lobby. Pt c/o of wait time and seeing people come in after her being taken back before her. This RN informed pt that we had to take back the most critical patient's first, including ones that lab work has come back abnormal, immunocompromised, etc. Also explained that patient saw people that are going back to our minor care area that just opened or [facility] beds to be seen that are not as acute as she is. Explained that she was the longest wait and would be going to an available treatment room when one came open. Pt asked if that was going to be another 10 hours, this RN informed her that I couldn't give a time frame on how long that will be, as I dont know. Apologized about the wait times and thanked her for her patience.

## 2019-10-06 NOTE — Discharge Instructions (Addendum)
You have a Crohn's flare today. Please follow-up with the gastroenterologist. I have given you the phone number as well as put a note in the charts of the should hopefully reach out to you. I would recommend calling tomorrow. Please take prednisone as prescribed. Please drink plenty fluids. You can also use some loperamide 4-60 mg daily which is an over-the-counter medication for your diarrhea. I recommend a brat diet which is bananas, rice, applesauce and toast. This may help with your diarrhea as well. Please use Tylenol 1000 mg you may take this every 6 hours.

## 2019-12-11 ENCOUNTER — Other Ambulatory Visit: Payer: Self-pay

## 2019-12-11 ENCOUNTER — Other Ambulatory Visit (HOSPITAL_BASED_OUTPATIENT_CLINIC_OR_DEPARTMENT_OTHER): Payer: Self-pay | Admitting: Emergency Medicine

## 2019-12-11 ENCOUNTER — Encounter (HOSPITAL_BASED_OUTPATIENT_CLINIC_OR_DEPARTMENT_OTHER): Payer: Self-pay | Admitting: *Deleted

## 2019-12-11 ENCOUNTER — Emergency Department (HOSPITAL_BASED_OUTPATIENT_CLINIC_OR_DEPARTMENT_OTHER)
Admission: EM | Admit: 2019-12-11 | Discharge: 2019-12-11 | Disposition: A | Discharge status: Home / Self Care | Payer: No Typology Code available for payment source | Attending: Emergency Medicine | Admitting: Emergency Medicine

## 2019-12-11 ENCOUNTER — Emergency Department (HOSPITAL_BASED_OUTPATIENT_CLINIC_OR_DEPARTMENT_OTHER): Payer: No Typology Code available for payment source

## 2019-12-11 DIAGNOSIS — Y990 Civilian activity done for income or pay: Secondary | ICD-10-CM | POA: Insufficient documentation

## 2019-12-11 DIAGNOSIS — W11XXXA Fall on and from ladder, initial encounter: Secondary | ICD-10-CM | POA: Insufficient documentation

## 2019-12-11 DIAGNOSIS — F17210 Nicotine dependence, cigarettes, uncomplicated: Secondary | ICD-10-CM | POA: Insufficient documentation

## 2019-12-11 DIAGNOSIS — S060X0A Concussion without loss of consciousness, initial encounter: Secondary | ICD-10-CM

## 2019-12-11 DIAGNOSIS — W228XXA Striking against or struck by other objects, initial encounter: Secondary | ICD-10-CM | POA: Insufficient documentation

## 2019-12-11 DIAGNOSIS — S0990XA Unspecified injury of head, initial encounter: Secondary | ICD-10-CM

## 2019-12-11 MED ORDER — METOCLOPRAMIDE HCL 5 MG/ML IJ SOLN
10.0000 mg | Freq: Once | INTRAMUSCULAR | Status: AC
  Administered 2019-12-11: 10 mg via INTRAMUSCULAR
  Filled 2019-12-11: qty 2

## 2019-12-11 MED ORDER — ONDANSETRON 4 MG PO TBDP: 4.0000 mg | ORAL_TABLET | Freq: Three times a day (TID) | ORAL | 0 refills | Status: DC | PRN

## 2019-12-11 MED ORDER — ACETAMINOPHEN 500 MG PO TABS
1000.0000 mg | ORAL_TABLET | Freq: Once | ORAL | Status: AC
  Administered 2019-12-11: 1000 mg via ORAL
  Filled 2019-12-11: qty 2

## 2019-12-11 MED FILL — ONDANSETRON ODT 4 MG TABLET: 4 | 21 days supply | Qty: 18 | Fill #0

## 2019-12-11 NOTE — ED Provider Notes (Signed)
[facility] EMERGENCY DEPARTMENT Provider Note   CSN: [id number] Arrival date & time: 12/11/19  1539     History Chief Complaint  Patient presents with  . Head Injury    Sarah Vasquez is a 39 y.o. female.  Patient is a 39 year old female with a history of Crohn's disease and ulcerative colitis who presents today after a fall at work.  Patient reports that she was up approximately 3 or 4 rungs on a ladder and she fell forward while at work trying to grab a shelf as she fell but a metal bar that was sticking out hit her in the right eyebrow and she then fell to the floor.  She denies loss of consciousness and reports in general she does not feel like she hurt anything in her arms or her legs but approximately 30 minutes after the event which occurred at 1130 she started to develop a headache and she reports a crescent shaped area of blurry vision in her right lateral eye.  She reports this has been persistent the headache has gotten slightly worse and is now radiating over the right side of her head and she has had nausea without vomiting.  She reports when she walks she feels like she is floating but she has not had any falls or difficulty with her coordination.  She does not take anticoagulation and denies any neck pain.  She reports the rest of her vision is normal and her left eye is unaffected.  She is not photosensitive and reports she has had migraines before but this feels different.  She initially was seen at urgent care and they referred her here for further evaluation and CT.  The history is provided by the patient.  Head Injury      Past Medical History:  Diagnosis Date  . Crohn disease ([place])     There are no problems to display for this patient.   History reviewed. No pertinent surgical history.   OB History   No obstetric history on file.     No family history on file.  Social History   Tobacco Use  . Smoking status: Current Every Day Smoker     Packs/day: 0.25    Types: Cigarettes  . Smokeless tobacco: Never Used  Substance Use Topics  . Alcohol use: Yes  . Drug use: Not Currently    Home Medications Prior to Admission medications   Medication Sig Start Date End Date Taking? Authorizing Provider  Apremilast (OTEZLA) 30 MG TABS Take 1 tablet (30 mg total) by mouth in the morning and at bedtime. 07/18/19   Carmin Muskrat, MD  dicyclomine (BENTYL) 20 MG tablet Take 1 tablet (20 mg total) by mouth 2 (two) times daily. 07/18/19   Carmin Muskrat, MD  diphenhydrAMINE (SOMINEX) 25 MG tablet Take 1 tablet by mouth daily as needed for itching.     [provider]  FLUoxetine (PROZAC) 40 MG capsule Take 1 capsule (40 mg total) by mouth daily. 07/18/19   Carmin Muskrat, MD  fluticasone [facility]) 50 MCG/ACT nasal spray Place 2 sprays into both nostrils daily for 14 days. 10/06/19 10/20/19  Tedd Sias, PA  methotrexate 250 MG/10ML injection Inject 25 mg into the muscle every 7 (seven) days.  06/05/19 06/04/20  [provider]    Allergies    Morphine and related, Adalimumab, Azathioprine, and Infliximab  Review of Systems   Review of Systems  All other systems reviewed and are negative.   Physical Exam Updated  Vital Signs BP (!) 160/100   Pulse 71   Temp 98.5 F (36.9 C)   Resp 18   Ht 5' 5"  (1.651 m)   Wt 89.2 kg   SpO2 100%   BMI 32.72 kg/m   Physical Exam Vitals and nursing note reviewed.  Constitutional:      General: She is in acute distress.     Appearance: She is well-developed.  HENT:     Head: Normocephalic and atraumatic.  Eyes:     General: No visual field deficit.    Extraocular Movements: Extraocular movements intact.     Conjunctiva/sclera: Conjunctivae normal.     Pupils: Pupils are equal, round, and reactive to light.     Comments: Mild tenderness with palpation over the right eyebrow and mild ecchymosis of the orbit.  Pupils are 3 mm and reactive bilaterally.  Normal consensual  constriction.  Cardiovascular:     Rate and Rhythm: Normal rate and regular rhythm.     Heart sounds: Normal heart sounds. No murmur heard.  No friction rub.  Pulmonary:     Effort: Pulmonary effort is normal.     Breath sounds: Normal breath sounds. No wheezing or rales.  Abdominal:     General: Bowel sounds are normal. There is no distension.     Palpations: Abdomen is soft.     Tenderness: There is no abdominal tenderness. There is no guarding or rebound.  Musculoskeletal:        General: No tenderness. Normal range of motion.     Cervical back: Normal range of motion and neck supple. No tenderness.     Comments: No edema  Skin:    General: Skin is warm and dry.     Findings: No rash.  Neurological:     General: No focal deficit present.     Mental Status: She is alert and oriented to person, place, and time. Mental status is at baseline.     Cranial Nerves: No cranial nerve deficit or facial asymmetry.     Sensory: Sensation is intact. No sensory deficit.     Motor: Motor function is intact. No weakness or pronator drift.     Coordination: Coordination normal. Heel to Shin Test normal.     Gait: Gait normal.     Comments: Able to count backwards from 100 x 6 without difficulty.  Visual fields are intact.  No facial asymmetry.  Psychiatric:        Mood and Affect: Mood normal.        Behavior: Behavior normal.        Thought Content: Thought content normal.     ED Results / Procedures / Treatments   Labs (all labs ordered are listed, but only abnormal results are displayed) Labs Reviewed - No data to display  EKG None  Radiology CT Head Wo Contrast  Result Date: 12/11/2019 CLINICAL DATA:  Facial trauma.  Poly trauma.  Fell off ladder. EXAM: CT HEAD WITHOUT CONTRAST CT MAXILLOFACIAL WITHOUT CONTRAST TECHNIQUE: Multidetector CT imaging of the head and maxillofacial structures were performed using the standard protocol without intravenous contrast. Multiplanar CT image  reconstructions of the maxillofacial structures were also generated. COMPARISON:  None. FINDINGS: CT HEAD FINDINGS Brain: No evidence of acute infarction, hemorrhage, hydrocephalus, extra-axial collection or mass lesion/mass effect. Vascular: Calcific atherosclerosis. Skull: No acute fracture. Other: No mastoid effusions. CT MAXILLOFACIAL FINDINGS Osseous: No fracture or mandibular dislocation. No destructive process. Orbits: Negative. No traumatic or inflammatory finding. Sinuses: Clear. Soft  tissues: Negative. Other: Dental caries and periapical lucencies. IMPRESSION: 1. No evidence of acute intracranial abnormality. 2. No acute facial fracture. Electronically Signed   By: Margaretha Sheffield MD   On: 12/11/2019 16:39   CT Maxillofacial Wo Contrast  Result Date: 12/11/2019 CLINICAL DATA:  Facial trauma.  Poly trauma.  Fell off ladder. EXAM: CT HEAD WITHOUT CONTRAST CT MAXILLOFACIAL WITHOUT CONTRAST TECHNIQUE: Multidetector CT imaging of the head and maxillofacial structures were performed using the standard protocol without intravenous contrast. Multiplanar CT image reconstructions of the maxillofacial structures were also generated. COMPARISON:  None. FINDINGS: CT HEAD FINDINGS Brain: No evidence of acute infarction, hemorrhage, hydrocephalus, extra-axial collection or mass lesion/mass effect. Vascular: Calcific atherosclerosis. Skull: No acute fracture. Other: No mastoid effusions. CT MAXILLOFACIAL FINDINGS Osseous: No fracture or mandibular dislocation. No destructive process. Orbits: Negative. No traumatic or inflammatory finding. Sinuses: Clear. Soft tissues: Negative. Other: Dental caries and periapical lucencies. IMPRESSION: 1. No evidence of acute intracranial abnormality. 2. No acute facial fracture. Electronically Signed   By: Margaretha Sheffield MD   On: 12/11/2019 16:39    Procedures Procedures (including critical care time)  Medications Ordered in ED Medications  metoCLOPramide (REGLAN)  injection 10 mg (has no administration in time range)  acetaminophen (TYLENOL) tablet 1,000 mg (has no administration in time range)    ED Course  I have reviewed the triage vital signs and the nursing notes.  Pertinent labs & imaging results that were available during my care of the patient were reviewed by me and considered in my medical decision making (see chart for details).    MDM Rules/Calculators/A&P                          Patient with a fall at work and injury to the face and head.  Patient is complaining of a headache and an area of blurriness in her right lateral eye.  Visual acuity is normal she has no conjunctival hemorrhage or conjunctival injection.  Extraocular movements are intact.  Lower suspicion for retrobulbar hematoma, retinal detachment.  Patient's pupils are reactive.  Concern for possible concussion.  Lower suspicion for intracranial hemorrhage however since symptoms started 4 hours ago and having neuro complaints we will do a head and facial CT for further evaluation.  Patient given IM Reglan and Tylenol as she reports ibuprofen did not help.  4:48 PM Imaging is neg.  Pt given precautions with concussion.  She has f/u with urgent care.  She was given antiemetic prn.  Given return precautions.  MDM Number of Diagnoses or Management Options   Amount and/or Complexity of Data Reviewed Tests in the radiology section of CPT: ordered and reviewed Independent visualization of images, tracings, or specimens: yes  Risk of Complications, Morbidity, and/or Mortality Presenting problems: moderate Diagnostic procedures: low Management options: low  Patient Progress Patient progress: stable    Final Clinical Impression(s) / ED Diagnoses Final diagnoses:  Minor head injury, initial encounter  Concussion without loss of consciousness, initial encounter    Rx / DC Orders ED Discharge Orders         Ordered    ondansetron (ZOFRAN ODT) 4 MG disintegrating tablet   Every 8 hours PRN        12/11/19 1649           Blanchie Dessert, MD 12/11/19 1651

## 2019-12-11 NOTE — Discharge Instructions (Signed)
Rest and stay well hydrated.  Avoid eye strain or heavy exertion.  Use the nausea meds as needed and tylenol/ibuprofen for headache.

## 2019-12-11 NOTE — ED Triage Notes (Addendum)
Sent from [facility] for ct scan ,Pt c/o head injury x 4 hrs ago , c/o blurred vision/ h/a.

## 2019-12-24 ENCOUNTER — Emergency Department (HOSPITAL_BASED_OUTPATIENT_CLINIC_OR_DEPARTMENT_OTHER)
Admission: EM | Admit: 2019-12-24 | Discharge: 2019-12-25 | Disposition: A | Discharge status: Home / Self Care | Payer: Medicaid - Out of State | Attending: Emergency Medicine | Admitting: Emergency Medicine

## 2019-12-24 ENCOUNTER — Encounter (HOSPITAL_BASED_OUTPATIENT_CLINIC_OR_DEPARTMENT_OTHER): Payer: Self-pay | Admitting: *Deleted

## 2019-12-24 ENCOUNTER — Other Ambulatory Visit: Payer: Self-pay

## 2019-12-24 DIAGNOSIS — K5090 Crohn's disease, unspecified, without complications: Secondary | ICD-10-CM | POA: Diagnosis not present

## 2019-12-24 DIAGNOSIS — R109 Unspecified abdominal pain: Secondary | ICD-10-CM | POA: Diagnosis present

## 2019-12-24 DIAGNOSIS — F17210 Nicotine dependence, cigarettes, uncomplicated: Secondary | ICD-10-CM | POA: Insufficient documentation

## 2019-12-24 LAB — CBC WITH DIFFERENTIAL/PLATELET
Abs Immature Granulocytes: 0.03 10*3/uL (ref 0.00–0.07)
Basophils Absolute: 0.1 10*3/uL (ref 0.0–0.1)
Basophils Relative: 1 %
Eosinophils Absolute: 0.4 10*3/uL (ref 0.0–0.5)
Eosinophils Relative: 3 %
HCT: 39.1 % (ref 36.0–46.0)
Hemoglobin: 13.2 g/dL (ref 12.0–15.0)
Immature Granulocytes: 0 %
Lymphocytes Relative: 43 %
Lymphs Abs: 5.4 10*3/uL — ABNORMAL HIGH (ref 0.7–4.0)
MCH: 29.7 pg (ref 26.0–34.0)
MCHC: 33.8 g/dL (ref 30.0–36.0)
MCV: 87.9 fL (ref 80.0–100.0)
Monocytes Absolute: 0.7 10*3/uL (ref 0.1–1.0)
Monocytes Relative: 6 %
Neutro Abs: 6 10*3/uL (ref 1.7–7.7)
Neutrophils Relative %: 47 %
Platelets: 347 10*3/uL (ref 150–400)
RBC: 4.45 MIL/uL (ref 3.87–5.11)
RDW: 13 % (ref 11.5–15.5)
WBC: 12.6 10*3/uL — ABNORMAL HIGH (ref 4.0–10.5)
nRBC: 0 % (ref 0.0–0.2)

## 2019-12-24 MED ORDER — SODIUM CHLORIDE 0.9 % IV BOLUS
1000.0000 mL | Freq: Once | INTRAVENOUS | Status: AC
  Administered 2019-12-24: 1000 mL via INTRAVENOUS

## 2019-12-24 MED ORDER — FENTANYL CITRATE (PF) 100 MCG/2ML IJ SOLN
50.0000 ug | Freq: Once | INTRAMUSCULAR | Status: AC
  Administered 2019-12-24: 50 ug via INTRAVENOUS
  Filled 2019-12-24: qty 2

## 2019-12-24 MED ORDER — ONDANSETRON HCL 4 MG/2ML IJ SOLN
4.0000 mg | Freq: Once | INTRAMUSCULAR | Status: AC
  Administered 2019-12-24: 4 mg via INTRAVENOUS
  Filled 2019-12-24: qty 2

## 2019-12-24 NOTE — ED Notes (Signed)
EDP at bedside  

## 2019-12-24 NOTE — ED Provider Notes (Signed)
[facility] EMERGENCY DEPARTMENT Provider Note   CSN: [id number] Arrival date & time: 12/24/19  2222     History Chief Complaint  Patient presents with  . Abdominal Pain    Sarah Vasquez is a 39 y.o. female.  Patient presents to the emergency department for evaluation of 3 or 4 days of worsening abdominal pain.  Patient complaining of pain across the upper and left upper abdomen.  She reports that today she started having nausea and has had numerous episodes of yellowish diarrhea.  She has not had a fever.  She has not had any rectal bleeding.  She does report that this is similar to previous flares of Crohn's that she has had.  Patient reports previous appendectomy, cholecystectomy as well as removal of intestine secondary to her Crohn's.  She was previously maintained on [place] and methotrexate, but is not currently taking anything because she recently moved from [place] and does not have a GI doctor.        Past Medical History:  Diagnosis Date  . Crohn disease ([place])     There are no problems to display for this patient.   History reviewed. No pertinent surgical history.   OB History   No obstetric history on file.     No family history on file.  Social History   Tobacco Use  . Smoking status: Current Every Day Smoker    Packs/day: 0.25    Types: Cigarettes  . Smokeless tobacco: Never Used  Substance Use Topics  . Alcohol use: Yes  . Drug use: Not Currently    Home Medications Prior to Admission medications   Medication Sig Start Date End Date Taking? Authorizing Provider  Apremilast (OTEZLA) 30 MG TABS Take 1 tablet (30 mg total) by mouth in the morning and at bedtime. 07/18/19   Carmin Muskrat, MD  dicyclomine (BENTYL) 20 MG tablet Take 1 tablet (20 mg total) by mouth 2 (two) times daily. 07/18/19   Carmin Muskrat, MD  diphenhydrAMINE (SOMINEX) 25 MG tablet Take 1 tablet by mouth daily as needed for itching.     [provider]   FLUoxetine (PROZAC) 40 MG capsule Take 1 capsule (40 mg total) by mouth daily. 07/18/19   Carmin Muskrat, MD  fluticasone [facility]) 50 MCG/ACT nasal spray Place 2 sprays into both nostrils daily for 14 days. 10/06/19 10/20/19  Tedd Sias, PA  methotrexate 250 MG/10ML injection Inject 25 mg into the muscle every 7 (seven) days.  06/05/19 06/04/20  [provider]  ondansetron (ZOFRAN ODT) 4 MG disintegrating tablet Take 1 tablet (4 mg total) by mouth every 8 (eight) hours as needed for nausea or vomiting. 12/11/19   Blanchie Dessert, MD    Allergies    Morphine and related, Adalimumab, Azathioprine, and Infliximab  Review of Systems   Review of Systems  Gastrointestinal: Positive for abdominal pain, diarrhea and nausea.  All other systems reviewed and are negative.   Physical Exam Updated Vital Signs BP (!) 136/98   Pulse 70   Temp 98.6 F (37 C) (Oral)   Resp 17   Ht 5' 5"  (1.651 m)   Wt 89.2 kg   SpO2 100%   BMI 32.72 kg/m   Physical Exam Vitals and nursing note reviewed.  Constitutional:      General: She is not in acute distress.    Appearance: Normal appearance. She is well-developed.  HENT:     Head: Normocephalic and atraumatic.     Right Ear: Hearing  normal.     Left Ear: Hearing normal.     Nose: Nose normal.  Eyes:     Conjunctiva/sclera: Conjunctivae normal.     Pupils: Pupils are equal, round, and reactive to light.  Cardiovascular:     Rate and Rhythm: Regular rhythm.     Heart sounds: S1 normal and S2 normal. No murmur heard.  No friction rub. No gallop.   Pulmonary:     Effort: Pulmonary effort is normal. No respiratory distress.     Breath sounds: Normal breath sounds.  Chest:     Chest wall: No tenderness.  Abdominal:     General: Bowel sounds are normal.     Palpations: Abdomen is soft.     Tenderness: There is abdominal tenderness in the epigastric area and left upper quadrant. There is no guarding or rebound. Negative signs include  Murphy's sign and McBurney's sign.     Hernia: No hernia is present.  Musculoskeletal:        General: Normal range of motion.     Cervical back: Normal range of motion and neck supple.  Skin:    General: Skin is warm and dry.     Findings: No rash.  Neurological:     Mental Status: She is alert and oriented to person, place, and time.     GCS: GCS eye subscore is 4. GCS verbal subscore is 5. GCS motor subscore is 6.     Cranial Nerves: No cranial nerve deficit.     Sensory: No sensory deficit.     Coordination: Coordination normal.  Psychiatric:        Speech: Speech normal.        Behavior: Behavior normal.        Thought Content: Thought content normal.     ED Results / Procedures / Treatments   Labs (all labs ordered are listed, but only abnormal results are displayed) Labs Reviewed  CBC WITH DIFFERENTIAL/PLATELET - Abnormal; Notable for the following components:      Result Value   WBC 12.6 (*)    Lymphs Abs 5.4 (*)    All other components within normal limits  COMPREHENSIVE METABOLIC PANEL - Abnormal; Notable for the following components:   Potassium 3.0 (*)    Glucose, Bld 121 (*)    All other components within normal limits  URINALYSIS, ROUTINE W REFLEX MICROSCOPIC - Abnormal; Notable for the following components:   Specific Gravity, Urine >1.030 (*)    All other components within normal limits  LIPASE, BLOOD  PREGNANCY, URINE    EKG None  Radiology CT ABDOMEN PELVIS W CONTRAST  Result Date: 12/25/2019 CLINICAL DATA:  Acute abdominal pain, Crohn's disease EXAM: CT ABDOMEN AND PELVIS WITH CONTRAST TECHNIQUE: Multidetector CT imaging of the abdomen and pelvis was performed using the standard protocol following bolus administration of intravenous contrast. CONTRAST:  133m OMNIPAQUE IOHEXOL 300 MG/ML  SOLN COMPARISON:  10/06/2019 FINDINGS: Lower chest: The visualized lung bases are clear bilaterally. The visualized heart and pericardium are unremarkable.  Hepatobiliary: Status post cholecystectomy. Liver unremarkable. No intra or extrahepatic biliary ductal dilation. Pancreas: Unremarkable Spleen: Unremarkable Adrenals/Urinary Tract: The adrenal glands are unremarkable. 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 There is compensatory hypertrophy of the right kidney which is otherwise unremarkable. The bladder is unremarkable. Stomach/Bowel: The stomach is unremarkable. There is surgical changes of ileocecectomy. There is circumferential bowel wall thickening and mild periluminal inflammatory stranding involving the neoterminal ileum spanning the roughly terminal 10 cm of small bowel  in keeping with inflammation related to the patient's underlying inflammatory bowel disease. There is no evidence of obstruction or perforation. No free intraperitoneal gas or fluid. No loculated intra-abdominal fluid collections. The large bowel is otherwise unremarkable. Altogether, the findings appears similar to those noted on prior examination. Vascular/Lymphatic: There is shotty mesenteric adenopathy, likely reactive in nature. No frankly pathologic adenopathy within the abdomen and pelvis. The abdominal vasculature demonstrates mildly variant anatomy with a high bifurcation of the aorta, but is otherwise unremarkable. Reproductive: Uterus and bilateral adnexa are unremarkable. Other: Small fat containing umbilical hernia, unchanged. Musculoskeletal: No lytic or blastic bone lesions are seen. IMPRESSION: Stable examination with inflammatory changes involving the terminal 10 cm of small bowel just proximal to the neoterminal ileum without evidence of obstruction or perforation. Electronically Signed   By: Fidela Salisbury MD   On: 12/25/2019 04:04    Procedures Procedures (including critical care time)  Medications Ordered in ED Medications  sodium chloride 0.9 % bolus 1,000 mL (0 mLs Intravenous Stopped 12/25/19 0102)  ondansetron (ZOFRAN) injection 4 mg (4 mg Intravenous Given  12/24/19 2326)  fentaNYL (SUBLIMAZE) injection 50 mcg (50 mcg Intravenous Given 12/24/19 2326)  fentaNYL (SUBLIMAZE) injection 50 mcg (50 mcg Intravenous Given 12/25/19 0100)  sodium chloride 0.9 % bolus 1,000 mL (0 mLs Intravenous Stopped 12/25/19 0227)  iohexol (OMNIPAQUE) 9 MG/ML oral solution 500 mL (500 mLs Oral Contrast Given 12/25/19 0305)  metoCLOPramide (REGLAN) injection 5 mg (5 mg Intravenous Given 12/25/19 0230)  iohexol (OMNIPAQUE) 300 MG/ML solution 100 mL (100 mLs Intravenous Contrast Given 12/25/19 0303)    ED Course  I have reviewed the triage vital signs and the nursing notes.  Pertinent labs & imaging results that were available during my care of the patient were reviewed by me and considered in my medical decision making (see chart for details).    MDM Rules/Calculators/A&P                          Patient presents to the emergency department for evaluation of abdominal pain.  Patient with stated history of Crohn's disease.  Reviewing records through epic she has previously been established with gastroenterology and treated with multiple medications for her Crohn's, but is currently in the area and has not established care with GI.  Lab work was unremarkable.  Patient underwent CT scan to further evaluate, as she is currently off of her medical regimen.  CT scan showed  Stable examination with inflammatory changes involving the terminal 10 cm of small bowel just proximal to the neoterminal ileum without evidence of obstruction or perforation.   When I reviewed previous CT scans through epic, it seems that she has had chronic inflammation in this area.  It is likely that this is her baseline.  There are no complicating features.  No evidence of obstruction, abscess, perforation.  Patient will not require hospitalization at this time.  She is feeling better after analgesia and fluids here in the ER.  She will need to follow-up with GI as an outpatient.  x   Final Clinical  Impression(s) / ED Diagnoses Final diagnoses:  Crohn's disease without complication, unspecified gastrointestinal tract location [facility])    Rx / DC Orders ED Discharge Orders    None       Chanci Ojala, Gwenyth Allegra, MD 12/25/19 [phone]

## 2019-12-24 NOTE — ED Triage Notes (Signed)
Abdominal pain. Hx of crohns. States she is having a flare.

## 2019-12-25 ENCOUNTER — Emergency Department (HOSPITAL_BASED_OUTPATIENT_CLINIC_OR_DEPARTMENT_OTHER): Payer: Medicaid - Out of State

## 2019-12-25 ENCOUNTER — Encounter (HOSPITAL_BASED_OUTPATIENT_CLINIC_OR_DEPARTMENT_OTHER): Payer: Self-pay | Admitting: Radiology

## 2019-12-25 LAB — COMPREHENSIVE METABOLIC PANEL
ALT: 19 U/L (ref 0–44)
AST: 22 U/L (ref 15–41)
Albumin: 3.9 g/dL (ref 3.5–5.0)
Alkaline Phosphatase: 82 U/L (ref 38–126)
Anion gap: 11 (ref 5–15)
BUN: 9 mg/dL (ref 6–20)
CO2: 22 mmol/L (ref 22–32)
Calcium: 9 mg/dL (ref 8.9–10.3)
Chloride: 106 mmol/L (ref 98–111)
Creatinine, Ser: 0.7 mg/dL (ref 0.44–1.00)
GFR, Estimated: >60 mL/min (ref >60)
Glucose, Bld: 121 mg/dL — ABNORMAL HIGH (ref 70–99)
Potassium: 3 mmol/L — ABNORMAL LOW (ref 3.5–5.1)
Sodium: 139 mmol/L (ref 135–145)
Total Bilirubin: 0.4 mg/dL (ref 0.3–1.2)
Total Protein: 7 g/dL (ref 6.5–8.1)

## 2019-12-25 LAB — LIPASE, BLOOD: Lipase: 44 U/L (ref 11–51)

## 2019-12-25 LAB — URINALYSIS, ROUTINE W REFLEX MICROSCOPIC
Bilirubin Urine: NEGATIVE
Glucose, UA: NEGATIVE mg/dL
Hgb urine dipstick: NEGATIVE
Ketones, ur: NEGATIVE mg/dL
Leukocytes,Ua: NEGATIVE
Nitrite: NEGATIVE
Protein, ur: NEGATIVE mg/dL
Specific Gravity, Urine: >1.03 — ABNORMAL HIGH (ref 1.005–1.030)
pH: 5.5 (ref 5.0–8.0)

## 2019-12-25 LAB — PREGNANCY, URINE: Preg Test, Ur: NEGATIVE

## 2019-12-25 MED ORDER — METOCLOPRAMIDE HCL 5 MG/ML IJ SOLN
5.0000 mg | Freq: Once | INTRAMUSCULAR | Status: AC
  Administered 2019-12-25: 5 mg via INTRAVENOUS
  Filled 2019-12-25: qty 2

## 2019-12-25 MED ORDER — IOHEXOL 9 MG/ML PO SOLN
500.0000 mL | ORAL | Status: AC
  Administered 2019-12-25 (×2): 500 mL via ORAL

## 2019-12-25 MED ORDER — HYDROCODONE-ACETAMINOPHEN 5-325 MG PO TABS: 1.0000 | ORAL_TABLET | ORAL | 0 refills | Status: AC | PRN

## 2019-12-25 MED ORDER — FENTANYL CITRATE (PF) 100 MCG/2ML IJ SOLN
50.0000 ug | Freq: Once | INTRAMUSCULAR | Status: AC
  Administered 2019-12-25: 50 ug via INTRAVENOUS
  Filled 2019-12-25: qty 2

## 2019-12-25 MED ORDER — SODIUM CHLORIDE 0.9 % IV BOLUS
1000.0000 mL | Freq: Once | INTRAVENOUS | Status: AC
  Administered 2019-12-25: 1000 mL via INTRAVENOUS

## 2019-12-25 MED ORDER — IOHEXOL 300 MG/ML  SOLN
100.0000 mL | Freq: Once | INTRAMUSCULAR | Status: AC | PRN
  Administered 2019-12-25: 100 mL via INTRAVENOUS

## 2019-12-25 MED ORDER — DIPHENOXYLATE-ATROPINE 2.5-0.025 MG PO TABS
1.0000 | ORAL_TABLET | Freq: Four times a day (QID) | ORAL | 0 refills | Status: AC | PRN
Start: 2019-12-25 — End: ?

## 2019-12-25 NOTE — ED Notes (Signed)
EDP at bedside  

## 2019-12-25 NOTE — ED Notes (Signed)
Given oral contrast by CT tech

## 2020-03-11 ENCOUNTER — Other Ambulatory Visit: Payer: Self-pay

## 2020-03-11 ENCOUNTER — Emergency Department (HOSPITAL_BASED_OUTPATIENT_CLINIC_OR_DEPARTMENT_OTHER): Payer: Medicaid - Out of State

## 2020-03-11 ENCOUNTER — Encounter (HOSPITAL_BASED_OUTPATIENT_CLINIC_OR_DEPARTMENT_OTHER): Payer: Self-pay

## 2020-03-11 ENCOUNTER — Emergency Department (HOSPITAL_BASED_OUTPATIENT_CLINIC_OR_DEPARTMENT_OTHER)
Admission: EM | Admit: 2020-03-11 | Discharge: 2020-03-11 | Disposition: A | Discharge status: Home / Self Care | Payer: Medicaid - Out of State | Attending: Emergency Medicine | Admitting: Emergency Medicine

## 2020-03-11 DIAGNOSIS — R1084 Generalized abdominal pain: Secondary | ICD-10-CM

## 2020-03-11 DIAGNOSIS — F17210 Nicotine dependence, cigarettes, uncomplicated: Secondary | ICD-10-CM | POA: Insufficient documentation

## 2020-03-11 DIAGNOSIS — R109 Unspecified abdominal pain: Secondary | ICD-10-CM | POA: Diagnosis present

## 2020-03-11 DIAGNOSIS — K5090 Crohn's disease, unspecified, without complications: Secondary | ICD-10-CM | POA: Insufficient documentation

## 2020-03-11 HISTORY — DX: Congenital malformation of kidney, unspecified: Q63.9

## 2020-03-11 HISTORY — DX: Obsessive-compulsive disorder, unspecified: F42.9

## 2020-03-11 HISTORY — DX: Depression, unspecified: F32.A

## 2020-03-11 HISTORY — DX: Ulcerative colitis, unspecified, without complications: K51.90

## 2020-03-11 HISTORY — DX: Anxiety disorder, unspecified: F41.9

## 2020-03-11 LAB — URINALYSIS, ROUTINE W REFLEX MICROSCOPIC
Glucose, UA: NEGATIVE mg/dL
Hgb urine dipstick: NEGATIVE
Ketones, ur: NEGATIVE mg/dL
Leukocytes,Ua: NEGATIVE
Nitrite: NEGATIVE
Protein, ur: NEGATIVE mg/dL
Specific Gravity, Urine: 1.03 (ref 1.005–1.030)
pH: 5 (ref 5.0–8.0)

## 2020-03-11 LAB — CBC
HCT: 40.6 % (ref 36.0–46.0)
Hemoglobin: 13.9 g/dL (ref 12.0–15.0)
MCH: 30 pg (ref 26.0–34.0)
MCHC: 34.2 g/dL (ref 30.0–36.0)
MCV: 87.7 fL (ref 80.0–100.0)
Platelets: 336 10*3/uL (ref 150–400)
RBC: 4.63 MIL/uL (ref 3.87–5.11)
RDW: 12.7 % (ref 11.5–15.5)
WBC: 12.9 10*3/uL — ABNORMAL HIGH (ref 4.0–10.5)
nRBC: 0 % (ref 0.0–0.2)

## 2020-03-11 LAB — COMPREHENSIVE METABOLIC PANEL
ALT: 18 U/L (ref 0–44)
AST: 18 U/L (ref 15–41)
Albumin: 4.1 g/dL (ref 3.5–5.0)
Alkaline Phosphatase: 91 U/L (ref 38–126)
Anion gap: 10 (ref 5–15)
BUN: 8 mg/dL (ref 6–20)
CO2: 20 mmol/L — ABNORMAL LOW (ref 22–32)
Calcium: 8.9 mg/dL (ref 8.9–10.3)
Chloride: 103 mmol/L (ref 98–111)
Creatinine, Ser: 0.76 mg/dL (ref 0.44–1.00)
GFR, Estimated: >60 mL/min (ref >60)
Glucose, Bld: 112 mg/dL — ABNORMAL HIGH (ref 70–99)
Potassium: 3.5 mmol/L (ref 3.5–5.1)
Sodium: 133 mmol/L — ABNORMAL LOW (ref 135–145)
Total Bilirubin: 0.1 mg/dL — ABNORMAL LOW (ref 0.3–1.2)
Total Protein: 7.1 g/dL (ref 6.5–8.1)

## 2020-03-11 LAB — PREGNANCY, URINE: Preg Test, Ur: NEGATIVE

## 2020-03-11 LAB — LIPASE, BLOOD: Lipase: 47 U/L (ref 11–51)

## 2020-03-11 MED ORDER — PREDNISONE 10 MG PO TABS: 40.0000 mg | ORAL_TABLET | Freq: Every day | ORAL | 0 refills | Status: AC

## 2020-03-11 MED ORDER — ONDANSETRON 4 MG PO TBDP: 4.0000 mg | ORAL_TABLET | Freq: Three times a day (TID) | ORAL | 0 refills | Status: AC | PRN

## 2020-03-11 MED ORDER — HYDROCODONE-ACETAMINOPHEN 5-325 MG PO TABS
1.0000 | ORAL_TABLET | Freq: Once | ORAL | Status: AC
Start: 2020-03-11 — End: 2020-03-11
  Administered 2020-03-11: 1 via ORAL
  Filled 2020-03-11: qty 1

## 2020-03-11 MED ORDER — IOHEXOL 300 MG/ML  SOLN
100.0000 mL | Freq: Once | INTRAMUSCULAR | Status: AC | PRN
  Administered 2020-03-11: 100 mL via INTRAVENOUS

## 2020-03-11 MED ORDER — SODIUM CHLORIDE 0.9 % IV BOLUS
500.0000 mL | Freq: Once | INTRAVENOUS | Status: AC
  Administered 2020-03-11: 500 mL via INTRAVENOUS

## 2020-03-11 MED ORDER — FENTANYL CITRATE (PF) 100 MCG/2ML IJ SOLN
50.0000 ug | Freq: Once | INTRAMUSCULAR | Status: AC
  Administered 2020-03-11: 50 ug via INTRAVENOUS
  Filled 2020-03-11: qty 2

## 2020-03-11 MED ORDER — PREDNISONE 20 MG PO TABS
20.0000 mg | ORAL_TABLET | Freq: Once | ORAL | Status: AC
  Administered 2020-03-11: 20 mg via ORAL
  Filled 2020-03-11: qty 1

## 2020-03-11 MED ORDER — ONDANSETRON HCL 4 MG/2ML IJ SOLN
4.0000 mg | Freq: Once | INTRAMUSCULAR | Status: AC
  Administered 2020-03-11: 4 mg via INTRAVENOUS
  Filled 2020-03-11: qty 2

## 2020-03-11 NOTE — Discharge Instructions (Signed)
At this time there does not appear to be the presence of an emergent medical condition, however there is always the potential for conditions to change. Please read and follow the below instructions.  Please return to the Emergency Department immediately for any new or worsening symptoms. Please be sure to follow up with your Primary Care Provider within one week regarding your visit today; please call their office to schedule an appointment even if you are feeling better for a follow-up visit. Please take the medication prednisone as prescribed to help with Crohn's flareup.  You are being given a 7-day course of 40 mg prednisone, you will need to see the gastroenterologist by the time of this course is up to have a refill.  Stopping steroids suddenly can lead to severe medical complications so be sure to go to your gastroenterologist appointment as scheduled next week for reevaluation and refill of prescriptions. You may use the medication Zofran as prescribed to help with your nausea and vomiting.  Zofran will dissolve under your tongue so do not swallow or chew it. 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  Please discuss this with your primary care provider and your OB/GYN at follow-up visit.  Additionally a small umbilical hernia was seen as well please discuss them with your primary care provider at your follow-up visit.  Go to the nearest Emergency Department immediately if: You have fever or chills   Please read the additional information packets attached to your discharge summary.  Do not take your medicine if  develop an itchy rash, swelling in your mouth or lips, or difficulty breathing; call 911 and seek immediate emergency medical attention if this occurs.  You may review your lab tests and imaging results in their entirety on your MyChart account.  Please discuss all results of fully with your primary care provider and other  specialist at your follow-up visit.  Note: Portions of this text may have been transcribed using voice recognition software. Every effort was made to ensure accuracy; however, inadvertent computerized transcription errors may still be present.

## 2020-03-11 NOTE — ED Notes (Signed)
Pt requesting pain meds. RN informed

## 2020-03-11 NOTE — ED Notes (Signed)
Gave patient sprite for PO challenge.

## 2020-03-11 NOTE — ED Provider Notes (Signed)
[facility] EMERGENCY DEPARTMENT Provider Note   CSN: [id number] Arrival date & time: 03/11/20  1430     History Chief Complaint  Patient presents with  . Abdominal Pain    Sarah Vasquez is a 40 y.o. 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  Patient presents today for abdominal pain nausea vomiting diarrhea x2 weeks she feels this is very similar to prior Crohn's flares.  She reports she has moved here from [place] and due to insurance problems does not currently have a GI specialist.  Patient reports she is not currently taking any Crohn's related medications.  She reports constant severe generalized abdominal pain for the past 2 weeks the pain does not radiate no clear aggravating or alleviating factors and is aching sharp in nature.  She reports multiple episodes of nonbloody/nonbilious emesis multiple episodes of diarrhea  Denies headache, fever/chills, sore throat, neck stiffness, cough, chest pain/shortness of breath, dysuria/hematuria, vaginal bleeding/discharge or any additional concerns  HPI     Past Medical History:  Diagnosis Date  . Anxiety   . Crohn disease ([place])   . Depression   . Kidney anomaly, congenital   . OCD (obsessive compulsive disorder)   . UC (ulcerative colitis) ([place])     There are no problems to display for this patient.   Past Surgical History:  Procedure Laterality Date  . COLON SURGERY    . FINGER SURGERY    . HIP SURGERY       OB History   No obstetric history on file.     No family history on file.  Social History   Tobacco Use  . Smoking status: Current Every Day Smoker    Types: Cigarettes  . Smokeless tobacco: Never Used  Substance Use Topics  . Alcohol use: Yes    Comment: occ  . Drug use: Not Currently    Home Medications Prior to Admission medications   Medication Sig Start Date End Date Taking? Authorizing Provider  ondansetron (ZOFRAN ODT) 4 MG disintegrating  tablet Take 1 tablet (4 mg total) by mouth every 8 (eight) hours as needed for nausea or vomiting. 03/11/20  Yes Nuala Alpha A, PA-C  predniSONE (DELTASONE) 10 MG tablet Take 4 tablets (40 mg total) by mouth daily for 5 days. 03/11/20 03/16/20 Yes Nuala Alpha A, PA-C  Apremilast (OTEZLA) 30 MG TABS Take 1 tablet (30 mg total) by mouth in the morning and at bedtime. 07/18/19   Carmin Muskrat, MD  dicyclomine (BENTYL) 20 MG tablet Take 1 tablet (20 mg total) by mouth 2 (two) times daily. 07/18/19   Carmin Muskrat, MD  diphenhydrAMINE (SOMINEX) 25 MG tablet Take 1 tablet by mouth daily as needed for itching.     [provider]  diphenoxylate-atropine (LOMOTIL) 2.5-0.025 MG tablet Take 1-2 tablets by mouth 4 (four) times daily as needed for diarrhea or loose stools. 12/25/19   Orpah Greek, MD  FLUoxetine (PROZAC) 40 MG capsule Take 1 capsule (40 mg total) by mouth daily. 07/18/19   Carmin Muskrat, MD  fluticasone [facility]) 50 MCG/ACT nasal spray Place 2 sprays into both nostrils daily for 14 days. 10/06/19 10/20/19  Tedd Sias, PA  HYDROcodone-acetaminophen (NORCO/VICODIN) 5-325 MG tablet Take 1 tablet by mouth every 4 (four) hours as needed for moderate pain. 12/25/19   Orpah Greek, MD  methotrexate 250 MG/10ML injection Inject 25 mg into the muscle every 7 (seven) days.  06/05/19 06/04/20  [provider]  Allergies    Morphine and related, Adalimumab, Azathioprine, and Infliximab  Review of Systems   Review of Systems Ten systems are reviewed and are negative for acute change except as noted in the HPI  Physical Exam Updated Vital Signs BP 124/89   Pulse 73   Temp 98.8 F (37.1 C) (Oral)   Resp 16   Ht 5' 5"  (1.651 m)   Wt 87.1 kg   SpO2 100%   BMI 31.95 kg/m   Physical Exam Constitutional:      General: She is not in acute distress.    Appearance: Normal appearance. She is well-developed. She is not ill-appearing or diaphoretic.   HENT:     Head: Normocephalic and atraumatic.  Eyes:     General: Vision grossly intact. Gaze aligned appropriately.     Pupils: Pupils are equal, round, and reactive to light.  Neck:     Trachea: Trachea and phonation normal.  Pulmonary:     Effort: Pulmonary effort is normal. No respiratory distress.  Abdominal:     General: There is no distension.     Palpations: Abdomen is soft.     Tenderness: There is generalized abdominal tenderness. There is guarding. There is no rebound.  Musculoskeletal:        General: Normal range of motion.     Cervical back: Normal range of motion.  Skin:    General: Skin is warm and dry.  Neurological:     Mental Status: She is alert.     GCS: GCS eye subscore is 4. GCS verbal subscore is 5. GCS motor subscore is 6.     Comments: Speech is clear and goal oriented, follows commands Major Cranial nerves without deficit, no facial droop Moves extremities without ataxia, coordination intact  Psychiatric:        Behavior: Behavior normal.     ED Results / Procedures / Treatments   Labs (all labs ordered are listed, but only abnormal results are displayed) Labs Reviewed  COMPREHENSIVE METABOLIC PANEL - Abnormal; Notable for the following components:      Result Value   Sodium 133 (*)    CO2 20 (*)    Glucose, Bld 112 (*)    Total Bilirubin 0.1 (*)    All other components within normal limits  CBC - Abnormal; Notable for the following components:   WBC 12.9 (*)    All other components within normal limits  URINALYSIS, ROUTINE W REFLEX MICROSCOPIC - Abnormal; Notable for the following components:   Bilirubin Urine SMALL (*)    All other components within normal limits  LIPASE, BLOOD  PREGNANCY, URINE    EKG EKG Interpretation  Date/Time:  Wednesday March 11 2020 17:15:55 EST Ventricular Rate:  60 PR Interval:    QRS Duration: 111 QT Interval:  441 QTC Calculation: 441 R Axis:   0 Text Interpretation: Sinus rhythm Low voltage,  extremity and precordial leads Confirmed by Davonna Belling [phone]) on 03/11/2020 8:56:29 PM   Radiology CT ABDOMEN PELVIS W CONTRAST  Result Date: 03/11/2020 CLINICAL DATA:  Generalized abdominal pain with nausea and vomiting for 2 weeks, history of ulcerative colitis, Crohn's disease, bowel resection, smoker, question abscess/infection EXAM: CT ABDOMEN AND PELVIS WITH CONTRAST TECHNIQUE: Multidetector CT imaging of the abdomen and pelvis was performed using the standard protocol following bolus administration of intravenous contrast. CONTRAST:  157m OMNIPAQUE IOHEXOL 300 MG/ML SOLN IV. No oral contrast. COMPARISON:  12/25/2019 FINDINGS: Lower chest: Lung bases clear Hepatobiliary: Gallbladder surgically absent. Liver  normal appearance Pancreas: Normal appearance Spleen: Normal appearance Adrenals/Urinary Tract: Adrenal glands normal appearance. Absent LEFT kidney, no mass at renal fossa. Compensatory hypertrophy of RIGHT kidney. No RIGHT renal mass, hydronephrosis, hydroureter, or ureteral dilatation. Bladder unremarkable. Stomach/Bowel: Prior ileocolic resection. Bowel wall thickening of terminal ileum extending to anastomosis consistent with history of Crohn's disease. No evidence of perforation or abscess. No colonic wall thickening identified. Rectum under distended, wall thickness poorly assessed. Vascular/Lymphatic: Vascular structures patent. Few scattered normal size mesenteric lymph nodes. No definite abdominal or pelvic adenopathy Reproductive: Probable exophytic leiomyoma at upper LEFT uterus 4.3 x 3.5 x 3.9 cm. Cyst LEFT ovary 2.6 cm; simple appearing cyst, no follow-up imaging recommended. Other: No free air or free fluid. Tiny umbilical hernia containing fat. Musculoskeletal: Osseous structures unremarkable. IMPRESSION: Bowel wall thickening of terminal ileum extending to anastomosis consistent with history of Crohn's disease; no evidence of perforation or abscess. Prior ileocolic resection.  Absent LEFT kidney with compensatory hypertrophy of RIGHT kidney. Probable exophytic leiomyoma at upper LEFT uterus 4.3 x 3.5 x 3.9 cm. Tiny umbilical hernia containing fat. Electronically Signed   By: Lavonia Dana M.D.   On: 03/11/2020 17:53    Procedures Procedures   Medications Ordered in ED Medications  HYDROcodone-acetaminophen (NORCO/VICODIN) 5-325 MG per tablet 1 tablet (has no administration in time range)  sodium chloride 0.9 % bolus 500 mL (0 mLs Intravenous Stopped 03/11/20 1751)  fentaNYL (SUBLIMAZE) injection 50 mcg (50 mcg Intravenous Given 03/11/20 1657)  ondansetron (ZOFRAN) injection 4 mg (4 mg Intravenous Given 03/11/20 1724)  iohexol (OMNIPAQUE) 300 MG/ML solution 100 mL (100 mLs Intravenous Contrast Given 03/11/20 1731)  sodium chloride 0.9 % bolus 500 mL (0 mLs Intravenous Stopped 03/11/20 1902)  predniSONE (DELTASONE) tablet 20 mg (20 mg Oral Given 03/11/20 2041)  predniSONE (DELTASONE) tablet 20 mg (20 mg Oral Given 03/11/20 2057)    ED Course  I have reviewed the triage vital signs and the nursing notes.  Pertinent labs & imaging results that were available during my care of the patient were reviewed by me and considered in my medical decision making (see chart for details).  Clinical Course as of 03/11/20 2106  Wed Mar 11, 2020  1827 75m prednisone daily and follow-up one week Dr. MWatt Climes[BM]    Clinical Course User Index [BM] MGari Crown  MDM Rules/Calculators/A&P                         Additional history obtained from: 1. Nursing notes from this visit. 2. EMR review, patient's last ED visit 12/24/2019 diagnosis Crohn's disease without complication.  It appears patient was encouraged to establish outpatient GI follow-up ------------------------------------ I reviewed and interpreted labs which include: CBC shows leukocytosis of 12.9, no anemia. Lipase within normal limits, doubt pancreatitis. CMP shows no emergent electrolyte derangement, AKI, LFT  elevations or gap. Urine pregnancy test negative. Urinalysis shows no evidence for infection.  EKG: Sinus rhythm Low voltage, extremity and precordial leads Confirmed by PDavonna Belling([id number] on 03/11/2020 8:56:29 PM  CT AP:  IMPRESSION:  Bowel wall thickening of terminal ileum extending to anastomosis  consistent with history of Crohn's disease; no evidence of  perforation or abscess.    Prior ileocolic resection.    Absent LEFT kidney with compensatory hypertrophy of RIGHT kidney.    Probable exophytic leiomyoma at upper LEFT uterus 4.3 x 3.5 x 3.9  cm.    Tiny umbilical hernia containing fat.  -  6:27 PM: Consult with gastroenterologist Dr. Watt Climes, recommend starting patient on 20-40 mg prednisone daily based on patient's previous doses and following up with his office in 1 week. - Patient reassessed resting comfortably no acute distress.  She is tolerating crackers and soda without recurrence of emesis.  She is requesting 1 additional dose of pain medication for her Crohn's flare before discharge today feels reasonable, she has a ride home today.  Will give 1 dose Norco prior to discharge patient denies allergy to this medication.  We will send patient home with prednisone, she reports in the past 40 mg was a "low-dose" for her.  Per gastroenterologist recommendations will start patient on 40 mg prednisone daily.  She is aware to call [facility] gastroenterology tomorrow morning to schedule her follow-up appointment for next week.  Patient provided with Zofran to help with nausea vomiting as needed.  At this time there does not appear to be any evidence of an acute emergency medical condition and the patient appears stable for discharge with appropriate outpatient follow up. Diagnosis was discussed with patient who verbalizes understanding of care plan and is agreeable to discharge. I have discussed return precautions with patient who verbalizes understanding. Patient encouraged to  follow-up with their PCP and gastroenterology. All questions answered.  Patient's case discussed with Dr. Alvino Chapel who agrees with plan to discharge with follow-up.   Note: Portions of this report may have been transcribed using voice recognition software. Every effort was made to ensure accuracy; however, inadvertent computerized transcription errors may still be present. Final Clinical Impression(s) / ED Diagnoses Final diagnoses:  Generalized abdominal pain  Crohn's disease without complication, unspecified gastrointestinal tract location [facility])    Rx / DC Orders ED Discharge Orders         Ordered    predniSONE (DELTASONE) 10 MG tablet  Daily        03/11/20 2102    ondansetron (ZOFRAN ODT) 4 MG disintegrating tablet  Every 8 hours PRN        03/11/20 2102           Gari Crown 03/11/20 2106    Davonna Belling, MD 03/11/20 2324

## 2020-03-11 NOTE — ED Notes (Signed)
Pt eating crackers and given drink. Pt requesting more pain medication. Erlene Quan PA aware.

## 2020-03-11 NOTE — ED Triage Notes (Signed)
Pt c/p abd pain, n/v/d x 2 weeks-NAD-steady gait

## 2020-05-18 ENCOUNTER — Emergency Department (HOSPITAL_BASED_OUTPATIENT_CLINIC_OR_DEPARTMENT_OTHER): Payer: Medicaid - Out of State

## 2020-05-18 ENCOUNTER — Emergency Department (HOSPITAL_BASED_OUTPATIENT_CLINIC_OR_DEPARTMENT_OTHER)
Admission: EM | Admit: 2020-05-18 | Discharge: 2020-05-18 | Disposition: A | Discharge status: Home / Self Care | Payer: Medicaid - Out of State | Attending: Emergency Medicine | Admitting: Emergency Medicine

## 2020-05-18 ENCOUNTER — Other Ambulatory Visit: Payer: Self-pay

## 2020-05-18 ENCOUNTER — Encounter (HOSPITAL_BASED_OUTPATIENT_CLINIC_OR_DEPARTMENT_OTHER): Payer: Self-pay | Admitting: *Deleted

## 2020-05-18 DIAGNOSIS — R1084 Generalized abdominal pain: Secondary | ICD-10-CM | POA: Diagnosis not present

## 2020-05-18 DIAGNOSIS — F17210 Nicotine dependence, cigarettes, uncomplicated: Secondary | ICD-10-CM | POA: Insufficient documentation

## 2020-05-18 DIAGNOSIS — R42 Dizziness and giddiness: Secondary | ICD-10-CM | POA: Diagnosis not present

## 2020-05-18 DIAGNOSIS — K921 Melena: Secondary | ICD-10-CM | POA: Diagnosis not present

## 2020-05-18 DIAGNOSIS — R109 Unspecified abdominal pain: Secondary | ICD-10-CM | POA: Diagnosis present

## 2020-05-18 LAB — COMPREHENSIVE METABOLIC PANEL
ALT: 22 U/L (ref 0–44)
AST: 21 U/L (ref 15–41)
Albumin: 4 g/dL (ref 3.5–5.0)
Alkaline Phosphatase: 88 U/L (ref 38–126)
Anion gap: 9 (ref 5–15)
BUN: 9 mg/dL (ref 6–20)
CO2: 25 mmol/L (ref 22–32)
Calcium: 9 mg/dL (ref 8.9–10.3)
Chloride: 103 mmol/L (ref 98–111)
Creatinine, Ser: 0.78 mg/dL (ref 0.44–1.00)
GFR, Estimated: >60 mL/min (ref >60)
Glucose, Bld: 158 mg/dL — ABNORMAL HIGH (ref 70–99)
Potassium: 3.2 mmol/L — ABNORMAL LOW (ref 3.5–5.1)
Sodium: 137 mmol/L (ref 135–145)
Total Bilirubin: 0.5 mg/dL (ref 0.3–1.2)
Total Protein: 7.6 g/dL (ref 6.5–8.1)

## 2020-05-18 LAB — CBC WITH DIFFERENTIAL/PLATELET
Abs Immature Granulocytes: 0.06 10*3/uL (ref 0.00–0.07)
Basophils Absolute: 0.1 10*3/uL (ref 0.0–0.1)
Basophils Relative: 1 %
Eosinophils Absolute: 0.4 10*3/uL (ref 0.0–0.5)
Eosinophils Relative: 3 %
HCT: 43.1 % (ref 36.0–46.0)
Hemoglobin: 14.5 g/dL (ref 12.0–15.0)
Immature Granulocytes: 1 %
Lymphocytes Relative: 42 %
Lymphs Abs: 5.6 10*3/uL — ABNORMAL HIGH (ref 0.7–4.0)
MCH: 30 pg (ref 26.0–34.0)
MCHC: 33.6 g/dL (ref 30.0–36.0)
MCV: 89 fL (ref 80.0–100.0)
Monocytes Absolute: 0.6 10*3/uL (ref 0.1–1.0)
Monocytes Relative: 5 %
Neutro Abs: 6.4 10*3/uL (ref 1.7–7.7)
Neutrophils Relative %: 48 %
Platelets: 393 10*3/uL (ref 150–400)
RBC: 4.84 MIL/uL (ref 3.87–5.11)
RDW: 13 % (ref 11.5–15.5)
Smear Review: NORMAL
WBC: 13.4 10*3/uL — ABNORMAL HIGH (ref 4.0–10.5)
nRBC: 0 % (ref 0.0–0.2)

## 2020-05-18 LAB — URINALYSIS, ROUTINE W REFLEX MICROSCOPIC
Bilirubin Urine: NEGATIVE
Glucose, UA: NEGATIVE mg/dL
Hgb urine dipstick: NEGATIVE
Ketones, ur: NEGATIVE mg/dL
Leukocytes,Ua: NEGATIVE
Nitrite: NEGATIVE
Protein, ur: NEGATIVE mg/dL
Specific Gravity, Urine: 1.01 (ref 1.005–1.030)
pH: 5.5 (ref 5.0–8.0)

## 2020-05-18 LAB — LIPASE, BLOOD: Lipase: 44 U/L (ref 11–51)

## 2020-05-18 LAB — PREGNANCY, URINE: Preg Test, Ur: NEGATIVE

## 2020-05-18 LAB — TROPONIN I (HIGH SENSITIVITY): Troponin I (High Sensitivity): <2 ng/L (ref <18)

## 2020-05-18 MED ORDER — HYDROMORPHONE HCL 1 MG/ML IJ SOLN
1.0000 mg | Freq: Once | INTRAMUSCULAR | Status: AC
Start: 2020-05-18 — End: 2020-05-18
  Administered 2020-05-18: 1 mg via INTRAVENOUS
  Filled 2020-05-18: qty 1

## 2020-05-18 MED ORDER — ONDANSETRON HCL 4 MG/2ML IJ SOLN
4.0000 mg | Freq: Once | INTRAMUSCULAR | Status: AC
  Administered 2020-05-18: 4 mg via INTRAVENOUS
  Filled 2020-05-18: qty 2

## 2020-05-18 MED ORDER — DICYCLOMINE HCL 20 MG PO TABS: 20.0000 mg | ORAL_TABLET | Freq: Two times a day (BID) | ORAL | 0 refills | Status: AC

## 2020-05-18 MED ORDER — POTASSIUM CHLORIDE ER 10 MEQ PO TBCR: 10.0000 meq | EXTENDED_RELEASE_TABLET | Freq: Every day | ORAL | 0 refills | Status: AC

## 2020-05-18 MED ORDER — IOHEXOL 300 MG/ML  SOLN
100.0000 mL | Freq: Once | INTRAMUSCULAR | Status: AC | PRN
  Administered 2020-05-18: 100 mL via INTRAVENOUS

## 2020-05-18 MED ORDER — MECLIZINE HCL 25 MG PO TABS
25.0000 mg | ORAL_TABLET | Freq: Once | ORAL | Status: AC
  Administered 2020-05-18: 25 mg via ORAL
  Filled 2020-05-18: qty 1

## 2020-05-18 MED ORDER — HYDROMORPHONE HCL 1 MG/ML IJ SOLN
1.0000 mg | Freq: Once | INTRAMUSCULAR | Status: AC
  Administered 2020-05-18: 1 mg via INTRAVENOUS
  Filled 2020-05-18: qty 1

## 2020-05-18 MED ORDER — ONDANSETRON HCL 4 MG PO TABS: 4.0000 mg | ORAL_TABLET | Freq: Three times a day (TID) | ORAL | 0 refills | Status: AC | PRN

## 2020-05-18 MED ORDER — OXYCODONE-ACETAMINOPHEN 5-325 MG PO TABS: 1.0000 | ORAL_TABLET | Freq: Four times a day (QID) | ORAL | 0 refills | Status: DC | PRN

## 2020-05-18 MED ORDER — AMOXICILLIN-POT CLAVULANATE 875-125 MG PO TABS: 1.0000 | ORAL_TABLET | Freq: Two times a day (BID) | ORAL | 0 refills | Status: AC

## 2020-05-18 MED ORDER — SODIUM CHLORIDE 0.9 % IV BOLUS
500.0000 mL | Freq: Once | INTRAVENOUS | Status: AC
  Administered 2020-05-18: 500 mL via INTRAVENOUS

## 2020-05-18 NOTE — ED Notes (Signed)
Patient transported to CT 

## 2020-05-18 NOTE — ED Notes (Signed)
Reviewed discharge instructions.  States has had some relief from symptoms.  Left ambulatory without difficulty

## 2020-05-18 NOTE — ED Triage Notes (Signed)
C/o rectal bleeding x 1 episode today and abd pain x 3 days

## 2020-05-18 NOTE — ED Provider Notes (Signed)
[facility] Provider Note   CSN: [id number] Arrival date & time: 05/18/20  1812     History CC:  Abdominal pain lightheadedness  Sarah Vasquez is a 40 y.o. 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  She reports onset of abdominal pain and blood in her stool approximately 3 days ago.  She feels like she passed a fairly large amount of blood today in her bowel movement.  She reports nausea and cramping diffuse abdominal pain.  She was concerned that today she began having a feeling of lightheadedness, tunnel vision, vertigo.  She has never had these symptoms before.  She feels weak.  She had formerly been following with out-of-state GI doctors in [place] for her ulcerative colitis and Crohn's.  However she had to move back to the state for family reasons, and currently is uninsured, therefore does not follow with gastroenterology.  She is not taking any of her former medication (methotrexate), and prefers not to take steroids (reports she lost her teeth from this).  Allergies to morphine ("makes me act crazy") but not to any other opioids  PSHx:  Bowel resection ('10 inches'), appendectomy, cholecystectomy  Med record review - GI visit on 06/04/19 [facility] - hx of ileal Crohn's disease s/p ileocectomy (Feb 2010).  Adverse reactions to remicade (psoriasis) and to 6-MP (pancreatitis).  Successfully switched to methotrexate and otezla.  Colonoscopy in Nov 2020 showing stricture at anastomosis site which was dilated with balloon, but no inflammation and normal mucosa  HPI     Past Medical History:  Diagnosis Date  . Anxiety   . Crohn disease ([place])   . Depression   . Kidney anomaly, congenital   . OCD (obsessive compulsive disorder)   . UC  (ulcerative colitis) ([place])     There are no problems to display for this patient.   Past Surgical History:  Procedure Laterality Date  . COLON SURGERY    . FINGER SURGERY    . HIP SURGERY       OB History   No obstetric history on file.     No family history on file.  Social History   Tobacco Use  . Smoking status: Current Every Day Smoker    Packs/day: 1.00    Types: Cigarettes  . Smokeless tobacco: Never Used  Substance Use Topics  . Alcohol use: Yes    Comment: occ  . Drug use: Not Currently    Home Medications Prior to Admission medications   Medication Sig Start Date End Date Taking? Authorizing Provider  amoxicillin-clavulanate (AUGMENTIN) 875-125 MG tablet Take 1 tablet by mouth 2 (two) times daily for 5 days. 05/18/20 05/23/20 Yes Roshawnda Pecora, Carola Rhine, MD  dicyclomine (BENTYL) 20 MG tablet Take 1 tablet (20 mg total) by mouth 2 (two) times daily for 20 days. 05/18/20 06/07/20 Yes Rishik Tubby, Carola Rhine, MD  ondansetron (ZOFRAN) 4 MG tablet Take 1 tablet (4 mg total) by mouth every 8 (eight) hours as needed for up to 15 doses for nausea or vomiting. 05/18/20  Yes Aleshia Cartelli, Carola Rhine, MD  oxyCODONE-acetaminophen (PERCOCET/ROXICET) 5-325 MG tablet Take 1 tablet by mouth every 6 (six) hours as needed for up to 12 doses for severe pain. 05/18/20  Yes Prem Coykendall, Carola Rhine, MD  potassium chloride (KLOR-CON) 10 MEQ tablet Take 1 tablet (10 mEq  total) by mouth daily for 30 doses. 05/18/20 06/17/20 Yes Claudie Brickhouse, Carola Rhine, MD  Apremilast (OTEZLA) 30 MG TABS Take 1 tablet (30 mg total) by mouth in the morning and at bedtime. 07/18/19   Carmin Muskrat, MD  dicyclomine (BENTYL) 20 MG tablet Take 1 tablet (20 mg total) by mouth 2 (two) times daily. 07/18/19   Carmin Muskrat, MD  diphenhydrAMINE (SOMINEX) 25 MG tablet Take 1 tablet by mouth daily as needed for itching.     [provider]  diphenoxylate-atropine (LOMOTIL) 2.5-0.025 MG tablet Take 1-2 tablets by mouth 4 (four) times daily as  needed for diarrhea or loose stools. 12/25/19   Orpah Greek, MD  FLUoxetine (PROZAC) 40 MG capsule Take 1 capsule (40 mg total) by mouth daily. 07/18/19   Carmin Muskrat, MD  fluticasone [facility]) 50 MCG/ACT nasal spray Place 2 sprays into both nostrils daily for 14 days. 10/06/19 10/20/19  Tedd Sias, PA  HYDROcodone-acetaminophen (NORCO/VICODIN) 5-325 MG tablet Take 1 tablet by mouth every 4 (four) hours as needed for moderate pain. 12/25/19   Orpah Greek, MD  methotrexate 250 MG/10ML injection Inject 25 mg into the muscle every 7 (seven) days.  06/05/19 06/04/20  [provider]  ondansetron (ZOFRAN ODT) 4 MG disintegrating tablet Take 1 tablet (4 mg total) by mouth every 8 (eight) hours as needed for nausea or vomiting. 03/11/20   Nuala Alpha A, PA-C    Allergies    Morphine and related, Adalimumab, Azathioprine, and Infliximab  Review of Systems   Review of Systems  Constitutional: Negative for chills and fever.  Eyes: Positive for visual disturbance. Negative for photophobia.  Respiratory: Negative for cough and shortness of breath.   Cardiovascular: Negative for chest pain and palpitations.  Gastrointestinal: Positive for abdominal pain, blood in stool and nausea. Negative for vomiting.  Genitourinary: Negative for dysuria and hematuria.  Musculoskeletal: Negative for arthralgias and myalgias.  Skin: Negative for color change and rash.  Neurological: Positive for dizziness, light-headedness and headaches. Negative for syncope.  All other systems reviewed and are negative.   Physical Exam Updated Vital Signs BP (!) 121/93   Pulse 78   Temp 98.6 F (37 C) (Oral)   Resp 16   Ht 5' 5"  (1.651 m)   Wt 88.5 kg   SpO2 97%   BMI 32.45 kg/m   Physical Exam Constitutional:      General: She is not in acute distress. HENT:     Head: Normocephalic and atraumatic.  Eyes:     Conjunctiva/sclera: Conjunctivae normal.     Pupils: Pupils are  equal, round, and reactive to light.  Cardiovascular:     Rate and Rhythm: Normal rate and regular rhythm.  Pulmonary:     Effort: Pulmonary effort is normal. No respiratory distress.  Abdominal:     General: There is no distension.     Tenderness: There is generalized abdominal tenderness.  Skin:    General: Skin is warm and dry.  Neurological:     General: No focal deficit present.     Mental Status: She is alert. Mental status is at baseline.     GCS: GCS eye subscore is 4. GCS verbal subscore is 5. GCS motor subscore is 6.     Cranial Nerves: Cranial nerves are intact.     Sensory: Sensation is intact.     Gait: Gait is intact.  Psychiatric:        Mood and Affect: Mood normal.  Behavior: Behavior normal.     ED Results / Procedures / Treatments   Labs (all labs ordered are listed, but only abnormal results are displayed) Labs Reviewed  COMPREHENSIVE METABOLIC PANEL - Abnormal; Notable for the following components:      Result Value   Potassium 3.2 (*)    Glucose, Bld 158 (*)    All other components within normal limits  CBC WITH DIFFERENTIAL/PLATELET - Abnormal; Notable for the following components:   WBC 13.4 (*)    Lymphs Abs 5.6 (*)    All other components within normal limits  LIPASE, BLOOD  PREGNANCY, URINE  URINALYSIS, ROUTINE W REFLEX MICROSCOPIC  TROPONIN I (HIGH SENSITIVITY)    EKG EKG Interpretation  Date/Time:  Monday May 18 2020 19:12:51 EDT Ventricular Rate:  79 PR Interval:  160 QRS Duration: 111 QT Interval:  396 QTC Calculation: 454 R Axis:   -60 Text Interpretation: Sinus rhythm No significant change since last tracing Confirmed by Octaviano Glow [phone]) on 05/18/2020 7:27:02 PM   Radiology CT ABDOMEN PELVIS W CONTRAST  Result Date: 05/18/2020 CLINICAL DATA:  Abdomen pain fever history of Crohn's history of ileocecectomy EXAM: CT ABDOMEN AND PELVIS WITH CONTRAST TECHNIQUE: Multidetector CT imaging of the abdomen and pelvis was  performed using the standard protocol following bolus administration of intravenous contrast. CONTRAST:  159m OMNIPAQUE IOHEXOL 300 MG/ML  SOLN COMPARISON:  CT 03/11/2020, 12/25/2019, pelvic ultrasound 04/15/2004 FINDINGS: Lower chest: Lung bases demonstrate no acute consolidation or effusion. Normal cardiac size. Hepatobiliary: Status post cholecystectomy. No focal hepatic abnormality. Stable mild intra and extrahepatic biliary dilatation Pancreas: Unremarkable. No pancreatic ductal dilatation or surrounding inflammatory changes. Spleen: Normal in size without focal abnormality. Adrenals/Urinary Tract: Adrenal glands are normal. Absent left kidney. Right kidney shows no mass or hydronephrosis. The bladder is normal Stomach/Bowel: The stomach is nonenlarged. Mild fluid distension of distal small bowel in the pelvis, this is proximal to the small bowel anastomosis with the colon. 7 cm segment of thickened small bowel proximal to the anastomosis with the colon. No other areas of acute bowel wall thickening or visualized. Previous partial right colon resection. Vascular/Lymphatic: Nonaneurysmal aorta.  No suspicious nodes. Reproductive: Bicornuate uterus.  2.3 cm left adnexal cyst Other: Negative for free air or free fluid. Musculoskeletal: No acute or significant osseous findings. IMPRESSION: 1. 7 cm segment of thickened terminal ileum proximal to the small bowel anastomosis with the colon, consistent with history of inflammatory bowel disease. There is mild fluid distension of distal small bowel in the pelvis, upstream to the thickened terminal ileum but no convincing evidence for obstruction. 2. Absent left kidney. 3. Bicornuate uterus. 4. 2.3 cm left adnexal cyst. No follow-up imaging recommended. Note: This recommendation does not apply to premenarchal patients and to those with increased risk (genetic, family history, elevated tumor markers or other high-risk factors) of ovarian cancer. Reference: JACR 2020 Feb;  17(2):248-254 Electronically Signed   By: KDonavan FoilM.D.   On: 05/18/2020 21:01    Procedures Procedures   Medications Ordered in ED Medications  sodium chloride 0.9 % bolus 500 mL (0 mLs Intravenous Stopped 05/18/20 2058)  HYDROmorphone (DILAUDID) injection 1 mg (1 mg Intravenous Given 05/18/20 1928)  meclizine (ANTIVERT) tablet 25 mg (25 mg Oral Given 05/18/20 1927)  ondansetron (ZOFRAN) injection 4 mg (4 mg Intravenous Given 05/18/20 1927)  iohexol (OMNIPAQUE) 300 MG/ML solution 100 mL (100 mLs Intravenous Contrast Given 05/18/20 2036)  HYDROmorphone (DILAUDID) injection 1 mg (1 mg Intravenous Given 05/18/20 2215)  ED Course  I have reviewed the triage vital signs and the nursing notes.  Pertinent labs & imaging results that were available during my care of the patient were reviewed by me and considered in my medical decision making (see chart for details).  This patient complains of abdominal pain, lightheadedness. This involves an extensive number of treatment options, and is a complaint that carries with it a high risk of complications and morbidity.  The differential diagnosis includes Crohn's/UC flare up vs other intraabdominal infection vs dehydration vs anemia vs arrhythmia vs other   Likely had a trigger for peripheral vertigo  I ordered, reviewed, and interpreted labs, with WBC 13.4, UA negative, hgb normal, K 3.2.  Cr 0.78. I ordered medication IV fluids, IV dilaudid, IV zofran, meclizine for nausea, vertigo, pain and dehydration I ordered imaging studies which included CT abdomenpelvis  I independently visualized and interpreted imaging which showed anastamoses thickening and the monitor tracing which showed NSR Previous records obtained and reviewed showing GI workup  Improvement of vertigo with fluids and meclizine.  Suspect this is peripheral and triggered by her intraabdominal symptoms.  Lower suspicion for stroke - no evident risk factors.  ECG and troponin  unremarkable - less likely ACS.  No signs or symptoms of PE - PERC negative.   Clinical Course as of 05/19/20 0044  Mon May 18, 2020  2111 IMPRESSION: 1. 7 cm segment of thickened terminal ileum proximal to the small bowel anastomosis with the colon, consistent with history of inflammatory bowel disease. There is mild fluid distension of distal small bowel in the pelvis, upstream to the thickened terminal ileum but no convincing evidence for obstruction. 2. Absent left kidney. 3. Bicornuate uterus. 4. 2.3 cm left adnexal cyst. No follow-up imaging recommended. Note: This recommendation does not apply to premenarchal patients and to those with increased risk (genetic, family history, elevated tumor markers or other high-risk factors) of ovarian cancer.  [MT]  2212 With her leukocytosis I am inclined to treat this as a possible colitis.  Particularly with her loose and bloody bowel movements.  She had a very bad reaction to Cipro and Flagyl last time.  He is willing to try Augmentin.  I warned her this may also cause some loose bowel movements.  Also prescribe her some potassium pills.  We describe some pain medications and nausea medications.  Unfortunately she is uninsured and cannot afford to see a GI doctor at this time, but is working on her insurance. [MT]    Clinical Course User Index [MT] Langston Masker Carola Rhine, MD    Final Clinical Impression(s) / ED Diagnoses Final diagnoses:  Abdominal pain, unspecified abdominal location    Rx / DC Orders ED Discharge Orders         Ordered    amoxicillin-clavulanate (AUGMENTIN) 875-125 MG tablet  2 times daily        05/18/20 2214    ondansetron (ZOFRAN) 4 MG tablet  Every 8 hours PRN        05/18/20 2214    dicyclomine (BENTYL) 20 MG tablet  2 times daily        05/18/20 2214    potassium chloride (KLOR-CON) 10 MEQ tablet  Daily        05/18/20 2214    oxyCODONE-acetaminophen (PERCOCET/ROXICET) 5-325 MG tablet  Every 6 hours PRN         05/18/20 2214           Wyvonnia Dusky, MD 05/19/20 [phone]

## 2020-05-18 NOTE — Discharge Instructions (Addendum)
Take Augmentin for 5 days.  This can cause some cramping and diarrhea.  You can take Bentyl for cramping.  You can take Zofran for nausea.  If you have severe pain, you can take Percocet.  Your potassium is also a little bit low today at 3.2.  You should take 1 potassium pill every morning for the next 30 days.

## 2020-06-07 ENCOUNTER — Encounter (HOSPITAL_BASED_OUTPATIENT_CLINIC_OR_DEPARTMENT_OTHER): Payer: Self-pay

## 2020-06-07 ENCOUNTER — Other Ambulatory Visit: Payer: Self-pay

## 2020-06-07 ENCOUNTER — Emergency Department (HOSPITAL_BASED_OUTPATIENT_CLINIC_OR_DEPARTMENT_OTHER)
Admission: EM | Admit: 2020-06-07 | Discharge: 2020-06-07 | Disposition: A | Discharge status: Home / Self Care | Payer: Medicaid - Out of State | Attending: Emergency Medicine | Admitting: Emergency Medicine

## 2020-06-07 ENCOUNTER — Emergency Department (HOSPITAL_BASED_OUTPATIENT_CLINIC_OR_DEPARTMENT_OTHER): Payer: Medicaid - Out of State

## 2020-06-07 DIAGNOSIS — J3489 Other specified disorders of nose and nasal sinuses: Secondary | ICD-10-CM | POA: Diagnosis not present

## 2020-06-07 DIAGNOSIS — K047 Periapical abscess without sinus: Secondary | ICD-10-CM | POA: Diagnosis not present

## 2020-06-07 DIAGNOSIS — F17210 Nicotine dependence, cigarettes, uncomplicated: Secondary | ICD-10-CM | POA: Diagnosis not present

## 2020-06-07 DIAGNOSIS — L03213 Periorbital cellulitis: Secondary | ICD-10-CM | POA: Diagnosis not present

## 2020-06-07 DIAGNOSIS — R0981 Nasal congestion: Secondary | ICD-10-CM | POA: Diagnosis not present

## 2020-06-07 DIAGNOSIS — R220 Localized swelling, mass and lump, head: Secondary | ICD-10-CM | POA: Diagnosis present

## 2020-06-07 LAB — CBC WITH DIFFERENTIAL/PLATELET
Abs Immature Granulocytes: 0.07 10*3/uL (ref 0.00–0.07)
Basophils Absolute: 0.1 10*3/uL (ref 0.0–0.1)
Basophils Relative: 1 %
Eosinophils Absolute: 0.5 10*3/uL (ref 0.0–0.5)
Eosinophils Relative: 3 %
HCT: 42 % (ref 36.0–46.0)
Hemoglobin: 14.2 g/dL (ref 12.0–15.0)
Immature Granulocytes: 0 %
Lymphocytes Relative: 31 %
Lymphs Abs: 5 10*3/uL — ABNORMAL HIGH (ref 0.7–4.0)
MCH: 30 pg (ref 26.0–34.0)
MCHC: 33.8 g/dL (ref 30.0–36.0)
MCV: 88.6 fL (ref 80.0–100.0)
Monocytes Absolute: 0.8 10*3/uL (ref 0.1–1.0)
Monocytes Relative: 5 %
Neutro Abs: 9.8 10*3/uL — ABNORMAL HIGH (ref 1.7–7.7)
Neutrophils Relative %: 60 %
Platelets: 366 10*3/uL (ref 150–400)
RBC: 4.74 MIL/uL (ref 3.87–5.11)
RDW: 12.7 % (ref 11.5–15.5)
Smear Review: NORMAL
WBC Morphology: >10
WBC: 16.3 10*3/uL — ABNORMAL HIGH (ref 4.0–10.5)
nRBC: 0 % (ref 0.0–0.2)

## 2020-06-07 LAB — PREGNANCY, URINE: Preg Test, Ur: NEGATIVE

## 2020-06-07 LAB — BASIC METABOLIC PANEL
Anion gap: 10 (ref 5–15)
BUN: 7 mg/dL (ref 6–20)
CO2: 24 mmol/L (ref 22–32)
Calcium: 9 mg/dL (ref 8.9–10.3)
Chloride: 103 mmol/L (ref 98–111)
Creatinine, Ser: 0.81 mg/dL (ref 0.44–1.00)
GFR, Estimated: >60 mL/min (ref >60)
Glucose, Bld: 101 mg/dL — ABNORMAL HIGH (ref 70–99)
Potassium: 3.7 mmol/L (ref 3.5–5.1)
Sodium: 137 mmol/L (ref 135–145)

## 2020-06-07 MED ORDER — CLINDAMYCIN HCL 300 MG PO CAPS: 300.0000 mg | ORAL_CAPSULE | Freq: Three times a day (TID) | ORAL | 0 refills | Status: AC

## 2020-06-07 MED ORDER — IOHEXOL 300 MG/ML  SOLN
100.0000 mL | Freq: Once | INTRAMUSCULAR | Status: AC | PRN
  Administered 2020-06-07: 80 mL via INTRAVENOUS

## 2020-06-07 MED ORDER — CLINDAMYCIN HCL 150 MG PO CAPS
300.0000 mg | ORAL_CAPSULE | Freq: Once | ORAL | Status: AC
  Administered 2020-06-07: 300 mg via ORAL
  Filled 2020-06-07: qty 2

## 2020-06-07 NOTE — Discharge Instructions (Signed)
Please take Tylenol (acetaminophen) to relieve your pain.  You may take tylenol, up to 1,000 mg (two extra strength pills).  Do not take more than 3,000 mg tylenol in a 24 hour period.  Please check all medication labels as many medications such as pain and cold medications may contain tylenol. Please do not drink alcohol while taking this medication.   You may have diarrhea from the antibiotics.  It is very important that you continue to take the antibiotics even if you get diarrhea unless a medical professional tells you that you may stop taking them.  If you stop too early the bacteria you are being treated for will become stronger and you may need different, more powerful antibiotics that have more side effects and worsening diarrhea.  Please stay well hydrated and consider probiotics as they may decrease the severity of your diarrhea.  Please be aware that if you take any hormonal contraception (birth control pills, nexplanon, the ring, etc) that your birth control will not work while you are taking antibiotics and you need to use back up protection as directed on the birth control medication information insert.    Please make sure you are drinking plenty of water to help flush out the contrast dye.

## 2020-06-07 NOTE — ED Provider Notes (Signed)
[facility] Provider Note   CSN: [id number] Arrival date & time: 06/07/20  1315     History Chief Complaint  Patient presents with  . Facial Swelling    Eye swelling    Sarah Vasquez is a 40 y.o. female with a past medical history of ulcerative colitis not currently on any immunosuppressants or modulators, anxiety, who presents today for evaluation of pain and swelling around her right eye.  She states that for the past 4 to 5 days she has been having a sinus infection.  She specifies this as nasal congestion, rhinorrhea, sore throat, and facial pressure bilaterally.  She states that this morning when she woke up she had swelling around her right eye.  She states that the vision feels slightly blurred.  She denies any fevers.  She otherwise feels at her normal baseline.  She was treated starting about 05/18/2020 with Augmentin for abdominal pain.  He states that she took that as prescribed and finished that.  HPI     Past Medical History:  Diagnosis Date  . Anxiety   . Crohn disease ([place])   . Depression   . Kidney anomaly, congenital   . OCD (obsessive compulsive disorder)   . UC (ulcerative colitis) ([place])     There are no problems to display for this patient.   Past Surgical History:  Procedure Laterality Date  . COLON SURGERY    . FINGER SURGERY    . HIP SURGERY       OB History   No obstetric history on file.     History reviewed. No pertinent family history.  Social History   Tobacco Use  . Smoking status: Current Every Day Smoker    Packs/day: 1.00    Types: Cigarettes  . Smokeless tobacco: Never Used  Substance Use Topics  . Alcohol use: Yes    Comment: occ  . Drug use: Not Currently    Home Medications Prior to Admission medications   Medication Sig Start Date End Date Taking? Authorizing Provider  clindamycin (CLEOCIN) 300 MG capsule Take 1 capsule (300 mg total) by mouth 3 (three) times daily for 7 days. 06/07/20  06/14/20 Yes Lorin Glass, PA-C  Apremilast (OTEZLA) 30 MG TABS Take 1 tablet (30 mg total) by mouth in the morning and at bedtime. 07/18/19   Carmin Muskrat, MD  dicyclomine (BENTYL) 20 MG tablet Take 1 tablet (20 mg total) by mouth 2 (two) times daily. 07/18/19   Carmin Muskrat, MD  dicyclomine (BENTYL) 20 MG tablet Take 1 tablet (20 mg total) by mouth 2 (two) times daily for 20 days. 05/18/20 06/07/20  Wyvonnia Dusky, MD  diphenhydrAMINE (SOMINEX) 25 MG tablet Take 1 tablet by mouth daily as needed for itching.     [provider]  diphenoxylate-atropine (LOMOTIL) 2.5-0.025 MG tablet Take 1-2 tablets by mouth 4 (four) times daily as needed for diarrhea or loose stools. 12/25/19   Orpah Greek, MD  FLUoxetine (PROZAC) 40 MG capsule Take 1 capsule (40 mg total) by mouth daily. 07/18/19   Carmin Muskrat, MD  fluticasone [facility]) 50 MCG/ACT nasal spray Place 2 sprays into both nostrils daily for 14 days. 10/06/19 10/20/19  Tedd Sias, PA  HYDROcodone-acetaminophen (NORCO/VICODIN) 5-325 MG tablet Take 1 tablet by mouth every 4 (four) hours as needed for moderate pain. 12/25/19   Orpah Greek, MD  ondansetron (ZOFRAN ODT) 4 MG disintegrating tablet Take 1 tablet (4 mg total) by mouth every 8 (  eight) hours as needed for nausea or vomiting. 03/11/20   Nuala Alpha A, PA-C  ondansetron (ZOFRAN) 4 MG tablet Take 1 tablet (4 mg total) by mouth every 8 (eight) hours as needed for up to 15 doses for nausea or vomiting. 05/18/20   Wyvonnia Dusky, MD  oxyCODONE-acetaminophen (PERCOCET/ROXICET) 5-325 MG tablet Take 1 tablet by mouth every 6 (six) hours as needed for up to 12 doses for severe pain. 05/18/20   Wyvonnia Dusky, MD  potassium chloride (KLOR-CON) 10 MEQ tablet Take 1 tablet (10 mEq total) by mouth daily for 30 doses. 05/18/20 06/17/20  Wyvonnia Dusky, MD    Allergies    Morphine and related, Penicillin g, Adalimumab, Azathioprine, and  Infliximab  Review of Systems   Review of Systems  Constitutional: Negative for chills and fever.  HENT: Positive for congestion, facial swelling, postnasal drip, rhinorrhea, sinus pressure and sinus pain.   Respiratory: Negative for cough and shortness of breath.   Gastrointestinal: Negative for abdominal pain.  Neurological: Negative for weakness and headaches.  All other systems reviewed and are negative.   Physical Exam Updated Vital Signs BP 139/87 (BP Location: Right Arm)   Pulse 71   Temp 98.6 F (37 C) (Oral)   Resp 18   Ht 5' 5"  (1.651 m)   Wt 87.5 kg   SpO2 99%   BMI 32.12 kg/m   Physical Exam Vitals and nursing note reviewed.  Constitutional:      General: She is not in acute distress.    Appearance: She is not diaphoretic.  HENT:     Head: Atraumatic.     Comments: Please see clinical images.  There is obvious edema around the right eye primarily on around the lower lid.  This area is tender.  There are multiple missing teeth and remaining teeth are in poor state with multiple broken teeth.  No obvious intraoral abscess.  TMs bilaterally are normal. When attempting to check for pain with EOMs patient is a difficult time describing if it hurts or not stating "I have a really weird pain tolerance."    Right Ear: Tympanic membrane normal.     Left Ear: Tympanic membrane normal.     Nose: Congestion and rhinorrhea present.     Mouth/Throat:     Mouth: Mucous membranes are moist.     Pharynx: Oropharynx is clear. No oropharyngeal exudate.  Eyes:     General: No scleral icterus.       Right eye: No discharge.        Left eye: No discharge.     Conjunctiva/sclera: Conjunctivae normal.  Cardiovascular:     Rate and Rhythm: Normal rate and regular rhythm.  Pulmonary:     Effort: Pulmonary effort is normal. No respiratory distress.     Breath sounds: No stridor.  Abdominal:     General: There is no distension.  Musculoskeletal:        General: No deformity.      Cervical back: Normal range of motion and neck supple. No rigidity.  Skin:    General: Skin is warm and dry.  Neurological:     Mental Status: She is alert.     Motor: No abnormal muscle tone.     Comments: Patient is awake and alert, answers questions appropriately.  Speech is not slurred.  Psychiatric:        Behavior: Behavior normal.         ED Results / Procedures / Treatments  Labs (all labs ordered are listed, but only abnormal results are displayed) Labs Reviewed  BASIC METABOLIC PANEL - Abnormal; Notable for the following components:      Result Value   Glucose, Bld 101 (*)    All other components within normal limits  CBC WITH DIFFERENTIAL/PLATELET - Abnormal; Notable for the following components:   WBC 16.3 (*)    Neutro Abs 9.8 (*)    Lymphs Abs 5.0 (*)    All other components within normal limits  PREGNANCY, URINE    EKG None  Radiology CT Maxillofacial W Contrast  Result Date: 06/07/2020 CLINICAL DATA:  Sinus infection with pain and congestion. Assess for abscess. Swelling around the right eye. EXAM: CT MAXILLOFACIAL WITH CONTRAST TECHNIQUE: Multidetector CT imaging of the maxillofacial structures was performed with intravenous contrast. Multiplanar CT image reconstructions were also generated. CONTRAST:  39m OMNIPAQUE IOHEXOL 300 MG/ML  SOLN COMPARISON:  None. FINDINGS: Osseous: No acute osseous finding. The patient has dental decay a. numerous areas of periodontal disease. Lateral cortical breakthrough associated with the root of right maxillary tooth 6, which could be the source of the regional inflammation. Orbits: Left orbit is normal. No postseptal orbital abnormality on the right. Preseptal soft tissue swelling particularly inferior to the orbit consistent with regional cellulitis, possibly originating from the right maxillary dental disease described above. No evidence of drainable abscess. Sinuses: Paranasal sinuses are clear. Soft tissues: Otherwise  negative. Limited intracranial: Normal IMPRESSION: Dental and periodontal disease. Lateral cortical breakthrough at the right maxilla associated with tooth 6. This is probably the source of regional soft tissue inflammation including preseptal periorbital inflammation on the right. No postseptal orbital inflammation. No evidence of drainable abscess. Electronically Signed   By: MNelson ChimesM.D.   On: 06/07/2020 17:49    Procedures Procedures   Medications Ordered in ED Medications  iohexol (OMNIPAQUE) 300 MG/ML solution 100 mL (80 mLs Intravenous Contrast Given 06/07/20 1729)  clindamycin (CLEOCIN) capsule 300 mg (300 mg Oral Given 06/07/20 1833)    ED Course  I have reviewed the triage vital signs and the nursing notes.  Pertinent labs & imaging results that were available during my care of the patient were reviewed by me and considered in my medical decision making (see chart for details).    MDM Rules/Calculators/A&P                         Patient is a 40year old woman who presents today for evaluation of facial swelling.  She developed what she reports is a sinus infection about 4 days ago and now woke up with facial swelling. She cannot clearly articulate if she has pain with extraocular range of motion's limiting my ability to assess for pre versus post septal cellulitis.'s are obtained and reviewed, she does have a slight leukocytosis at 16.3, BMP is unremarkable and pregnancy test is negative. CT max face is obtained showing dental and periodontal disease with cortical breakthrough at the right maxilla at tooth #6 and suspected dental source of her facial swelling.  Her sinuses are clear bilaterally. Given that she just recently had Augmentin and developed this infection I think that a repeat dose of Augmentin given that she just finished it would be a ineffective and poor choice at this time. Given this we will give her clindamycin. We discussed the importance of dental follow-up to  get the underlying dental issues fixed otherwise that this will recur and she states her understanding.  Return precautions were discussed with patient who states their understanding.  At the time of discharge patient denied any unaddressed complaints or concerns.  Patient is agreeable for discharge home.  Note: Portions of this report may have been transcribed using voice recognition software. Every effort was made to ensure accuracy; however, inadvertent computerized transcription errors may be present  Final Clinical Impression(s) / ED Diagnoses Final diagnoses:  Dental infection  Preseptal cellulitis of right eye    Rx / DC Orders ED Discharge Orders         Ordered    clindamycin (CLEOCIN) 300 MG capsule  3 times daily        06/07/20 1827           Ollen Gross 06/08/20 0118    Breck Coons, MD 06/12/20 [phone]

## 2020-06-07 NOTE — ED Triage Notes (Signed)
Pt states had a "sinus infection" for the past week. Woke up this morning with swelling around right eye. States vision is slightly blurred

## 2020-07-24 ENCOUNTER — Encounter (HOSPITAL_BASED_OUTPATIENT_CLINIC_OR_DEPARTMENT_OTHER): Payer: Self-pay

## 2020-07-24 ENCOUNTER — Other Ambulatory Visit: Payer: Self-pay

## 2020-07-24 ENCOUNTER — Emergency Department (HOSPITAL_BASED_OUTPATIENT_CLINIC_OR_DEPARTMENT_OTHER)
Admission: EM | Admit: 2020-07-24 | Discharge: 2020-07-25 | Disposition: A | Discharge status: Home / Self Care | Payer: Medicaid - Out of State | Attending: Emergency Medicine | Admitting: Emergency Medicine

## 2020-07-24 DIAGNOSIS — M461 Sacroiliitis, not elsewhere classified: Secondary | ICD-10-CM | POA: Insufficient documentation

## 2020-07-24 DIAGNOSIS — F17210 Nicotine dependence, cigarettes, uncomplicated: Secondary | ICD-10-CM | POA: Insufficient documentation

## 2020-07-24 DIAGNOSIS — M545 Low back pain, unspecified: Secondary | ICD-10-CM

## 2020-07-24 NOTE — ED Triage Notes (Signed)
Pt c/o lower back pain/spasms x 1 week-denies injury-NAD-slow steady gait

## 2020-07-25 ENCOUNTER — Encounter (HOSPITAL_BASED_OUTPATIENT_CLINIC_OR_DEPARTMENT_OTHER): Payer: Self-pay | Admitting: Emergency Medicine

## 2020-07-25 MED ORDER — METHOCARBAMOL 500 MG PO TABS: 500.0000 mg | ORAL_TABLET | Freq: Two times a day (BID) | ORAL | 0 refills | Status: AC

## 2020-07-25 MED ORDER — DICLOFENAC SODIUM 1 % EX GEL: 4.0000 g | Freq: Four times a day (QID) | CUTANEOUS | 0 refills | Status: AC

## 2020-07-25 MED ORDER — PREDNISONE 50 MG PO TABS
60.0000 mg | ORAL_TABLET | Freq: Once | ORAL | Status: AC
  Administered 2020-07-25: 60 mg via ORAL
  Filled 2020-07-25: qty 1

## 2020-07-25 MED ORDER — LIDOCAINE 5 % EX PTCH
2.0000 | MEDICATED_PATCH | CUTANEOUS | Status: DC
  Administered 2020-07-25: 2 via TRANSDERMAL
  Filled 2020-07-25: qty 2

## 2020-07-25 MED ORDER — METHOCARBAMOL 500 MG PO TABS
1000.0000 mg | ORAL_TABLET | ORAL | Status: AC
  Administered 2020-07-25: 1000 mg via ORAL
  Filled 2020-07-25: qty 2

## 2020-07-25 MED ORDER — LIDOCAINE 5 % EX PTCH: 1.0000 | MEDICATED_PATCH | CUTANEOUS | 0 refills | Status: AC

## 2020-07-25 NOTE — ED Provider Notes (Signed)
[facility] Provider Note   CSN: [id number] Arrival date & time: 07/24/20  2141     History Chief Complaint  Patient presents with   Back Pain    Sarah Vasquez is a 40 y.o. female.  The history is provided by the patient.  Back Pain Location:  Sacro-iliac joint Quality:  Cramping Radiates to:  Does not radiate Pain severity:  Severe Pain is:  Same all the time Onset quality:  Gradual Duration: days. Progression:  Worsening Chronicity:  New Context: not recent injury and not twisting   Relieved by:  Nothing Worsened by:  Nothing Ineffective treatments:  None tried Associated symptoms: no abdominal pain, no bladder incontinence, no fever, no headaches and no weakness   Risk factors: no hx of cancer   No f/c/r.  No weakness, no numbness.  No bowel or bladder symptoms.      Past Medical History:  Diagnosis Date   Anxiety    Crohn disease ([place])    Depression    Kidney anomaly, congenital    OCD (obsessive compulsive disorder)    UC (ulcerative colitis) ([place])     There are no problems to display for this patient.   Past Surgical History:  Procedure Laterality Date   COLON SURGERY     FINGER SURGERY     HIP SURGERY       OB History   No obstetric history on file.     History reviewed. No pertinent family history.  Social History   Tobacco Use   Smoking status: Every Day    Packs/day: 1.00    Pack years: 0.00    Types: Cigarettes   Smokeless tobacco: Never  Substance Use Topics   Alcohol use: Yes    Comment: occ   Drug use: Not Currently    Home Medications Prior to Admission medications   Medication Sig Start Date End Date Taking? Authorizing Provider  Apremilast (OTEZLA) 30 MG TABS Take 1 tablet (30 mg total) by mouth in the morning and at bedtime. 07/18/19   Carmin Muskrat, MD  dicyclomine (BENTYL) 20 MG tablet Take 1 tablet (20 mg total) by mouth 2 (two) times daily. 07/18/19   Carmin Muskrat, MD   dicyclomine (BENTYL) 20 MG tablet Take 1 tablet (20 mg total) by mouth 2 (two) times daily for 20 days. 05/18/20 06/07/20  Wyvonnia Dusky, MD  diphenhydrAMINE (SOMINEX) 25 MG tablet Take 1 tablet by mouth daily as needed for itching.     [provider]  diphenoxylate-atropine (LOMOTIL) 2.5-0.025 MG tablet Take 1-2 tablets by mouth 4 (four) times daily as needed for diarrhea or loose stools. 12/25/19   Orpah Greek, MD  FLUoxetine (PROZAC) 40 MG capsule Take 1 capsule (40 mg total) by mouth daily. 07/18/19   Carmin Muskrat, MD  fluticasone [facility]) 50 MCG/ACT nasal spray Place 2 sprays into both nostrils daily for 14 days. 10/06/19 10/20/19  Tedd Sias, PA  HYDROcodone-acetaminophen (NORCO/VICODIN) 5-325 MG tablet Take 1 tablet by mouth every 4 (four) hours as needed for moderate pain. 12/25/19   Orpah Greek, MD  ondansetron (ZOFRAN ODT) 4 MG disintegrating tablet Take 1 tablet (4 mg total) by mouth every 8 (eight) hours as needed for nausea or vomiting. 03/11/20   Nuala Alpha A, PA-C  ondansetron (ZOFRAN) 4 MG tablet Take 1 tablet (4 mg total) by mouth every 8 (eight) hours as needed for up to 15 doses for nausea or vomiting. 05/18/20   Trifan,  Carola Rhine, MD  oxyCODONE-acetaminophen (PERCOCET/ROXICET) 5-325 MG tablet Take 1 tablet by mouth every 6 (six) hours as needed for up to 12 doses for severe pain. 05/18/20   Wyvonnia Dusky, MD  potassium chloride (KLOR-CON) 10 MEQ tablet Take 1 tablet (10 mEq total) by mouth daily for 30 doses. 05/18/20 06/17/20  Wyvonnia Dusky, MD    Allergies    Morphine and related, Penicillin g, Adalimumab, Azathioprine, and Infliximab  Review of Systems   Review of Systems  Constitutional:  Negative for fever.  HENT:  Negative for drooling.   Eyes:  Negative for redness.  Respiratory:  Negative for wheezing.   Cardiovascular:  Negative for leg swelling.  Gastrointestinal:  Negative for abdominal pain.  Genitourinary:   Negative for bladder incontinence and difficulty urinating.  Musculoskeletal:  Positive for back pain.  Skin:  Negative for rash.  Neurological:  Negative for facial asymmetry, weakness and headaches.  Psychiatric/Behavioral:  Negative for agitation.   All other systems reviewed and are negative.  Physical Exam Updated Vital Signs BP (!) 148/101 (BP Location: Left Arm)   Pulse 96   Temp 99.2 F (37.3 C) (Oral)   Resp 18   Ht 5' 5"  (1.651 m)   Wt 88.9 kg   SpO2 100%   BMI 32.62 kg/m   Physical Exam Vitals and nursing note reviewed.  Constitutional:      General: She is not in acute distress.    Appearance: Normal appearance.  HENT:     Head: Normocephalic and atraumatic.     Nose: Nose normal.  Eyes:     Conjunctiva/sclera: Conjunctivae normal.     Pupils: Pupils are equal, round, and reactive to light.  Cardiovascular:     Rate and Rhythm: Normal rate and regular rhythm.     Pulses: Normal pulses.     Heart sounds: Normal heart sounds.  Pulmonary:     Effort: Pulmonary effort is normal.     Breath sounds: Normal breath sounds.  Abdominal:     General: Abdomen is flat. Bowel sounds are normal.     Palpations: Abdomen is soft.     Tenderness: There is no abdominal tenderness. There is no guarding.  Musculoskeletal:        General: Normal range of motion.     Cervical back: Normal, normal range of motion and neck supple.     Thoracic back: Normal.     Lumbar back: Normal.     Comments: Gait normal   Skin:    General: Skin is warm and dry.     Capillary Refill: Capillary refill takes less than 2 seconds.  Neurological:     General: No focal deficit present.     Mental Status: She is alert and oriented to person, place, and time.     Deep Tendon Reflexes: Reflexes normal.  Psychiatric:        Mood and Affect: Mood normal.        Behavior: Behavior normal.    ED Results / Procedures / Treatments   Labs (all labs ordered are listed, but only abnormal results are  displayed) Labs Reviewed - No data to display  EKG None  Radiology No results found.  Procedures Procedures   Medications Ordered in ED Medications  methocarbamol (ROBAXIN) tablet 1,000 mg (has no administration in time range)  lidocaine (LIDODERM) 5 % 2 patch (has no administration in time range)  predniSONE (DELTASONE) tablet 60 mg (has no administration in time range)  ED Course  I have reviewed the triage vital signs and the nursing notes.  Pertinent labs & imaging results that were available during my care of the patient were reviewed by me and considered in my medical decision making (see chart for details).    No red flags.  No weakness no numbness.  No f/c/r.  No instrumentation.  Will start lidoderm and robaxin.  Follow up with your PMD.  Strict return precautions given,    Aalina Botkin was evaluated in Emergency Department on 07/25/2020 for the symptoms described in the history of present illness. She was evaluated in the context of the global COVID-19 pandemic, which necessitated consideration that the patient might be at risk for infection with the SARS-CoV-2 virus that causes COVID-19. Institutional protocols and algorithms that pertain to the evaluation of patients at risk for COVID-19 are in a state of rapid change based on information released by regulatory bodies including the CDC and federal and state organizations. These policies and algorithms were followed during the patient's care in the ED.  Final Clinical Impression(s) / ED Diagnoses Final diagnoses:  None    Return for intractable cough, coughing up blood, fevers > 100.4 unrelieved by medication, shortness of breath, intractable vomiting, chest pain, shortness of breath, weakness, numbness, changes in speech, facial asymmetry, abdominal pain, passing out, Inability to tolerate liquids or food, cough, altered mental status or any concerns. No signs of systemic illness or infection. The patient is  nontoxic-appearing on exam and vital signs are within normal limits. I have reviewed the triage vital signs and the nursing notes. Pertinent labs & imaging results that were available during my care of the patient were reviewed by me and considered in my medical decision making (see chart for details). After history, exam, and medical workup I feel the patient has been appropriately medically screened and is safe for discharge home. Pertinent diagnoses were discussed with the patient. Patient was given return precautions.    Rx / DC Orders ED Discharge Orders     None        Kapil Petropoulos, MD 07/25/20 0005

## 2020-09-30 ENCOUNTER — Other Ambulatory Visit: Payer: Self-pay | Admitting: Physician Assistant

## 2020-09-30 DIAGNOSIS — K50019 Crohn's disease of small intestine with unspecified complications: Secondary | ICD-10-CM

## 2020-10-09 ENCOUNTER — Other Ambulatory Visit: Payer: Medicaid - Out of State

## 2020-10-21 ENCOUNTER — Other Ambulatory Visit: Payer: Self-pay

## 2020-10-21 ENCOUNTER — Ambulatory Visit
Admission: RE | Admit: 2020-10-21 | Discharge: 2020-10-21 | Disposition: A | Discharge status: Home / Self Care | Payer: Medicaid - Out of State | Source: Ambulatory Visit | Attending: Physician Assistant | Admitting: Physician Assistant

## 2020-10-21 DIAGNOSIS — K50019 Crohn's disease of small intestine with unspecified complications: Secondary | ICD-10-CM

## 2020-10-21 MED ORDER — IOPAMIDOL (ISOVUE-300) INJECTION 61%
100.0000 mL | Freq: Once | INTRAVENOUS | Status: AC | PRN
  Administered 2020-10-21: 100 mL via INTRAVENOUS

## 2020-10-23 ENCOUNTER — Emergency Department (HOSPITAL_COMMUNITY)
Admission: EM | Admit: 2020-10-23 | Discharge: 2020-10-23 | Disposition: A | Discharge status: Home / Self Care | Payer: 59 | Attending: Emergency Medicine | Admitting: Emergency Medicine

## 2020-10-23 ENCOUNTER — Encounter (HOSPITAL_COMMUNITY): Payer: Self-pay

## 2020-10-23 DIAGNOSIS — F17210 Nicotine dependence, cigarettes, uncomplicated: Secondary | ICD-10-CM | POA: Insufficient documentation

## 2020-10-23 DIAGNOSIS — R110 Nausea: Secondary | ICD-10-CM | POA: Diagnosis not present

## 2020-10-23 DIAGNOSIS — R1084 Generalized abdominal pain: Secondary | ICD-10-CM | POA: Insufficient documentation

## 2020-10-23 DIAGNOSIS — R197 Diarrhea, unspecified: Secondary | ICD-10-CM | POA: Insufficient documentation

## 2020-10-23 DIAGNOSIS — R109 Unspecified abdominal pain: Secondary | ICD-10-CM

## 2020-10-23 LAB — URINALYSIS, ROUTINE W REFLEX MICROSCOPIC
Bilirubin Urine: NEGATIVE
Glucose, UA: NEGATIVE mg/dL
Hgb urine dipstick: NEGATIVE
Ketones, ur: NEGATIVE mg/dL
Leukocytes,Ua: NEGATIVE
Nitrite: NEGATIVE
Protein, ur: NEGATIVE mg/dL
Specific Gravity, Urine: 1.014 (ref 1.005–1.030)
pH: 6 (ref 5.0–8.0)

## 2020-10-23 LAB — COMPREHENSIVE METABOLIC PANEL
ALT: 17 U/L (ref 0–44)
AST: 12 U/L — ABNORMAL LOW (ref 15–41)
Albumin: 3.7 g/dL (ref 3.5–5.0)
Alkaline Phosphatase: 84 U/L (ref 38–126)
Anion gap: 5 (ref 5–15)
BUN: 13 mg/dL (ref 6–20)
CO2: 24 mmol/L (ref 22–32)
Calcium: 8.9 mg/dL (ref 8.9–10.3)
Chloride: 113 mmol/L — ABNORMAL HIGH (ref 98–111)
Creatinine, Ser: 0.69 mg/dL (ref 0.44–1.00)
GFR, Estimated: >60 mL/min (ref >60)
Glucose, Bld: 106 mg/dL — ABNORMAL HIGH (ref 70–99)
Potassium: 3.6 mmol/L (ref 3.5–5.1)
Sodium: 142 mmol/L (ref 135–145)
Total Bilirubin: 0.5 mg/dL (ref 0.3–1.2)
Total Protein: 6.9 g/dL (ref 6.5–8.1)

## 2020-10-23 LAB — CBC
HCT: 42.5 % (ref 36.0–46.0)
Hemoglobin: 14 g/dL (ref 12.0–15.0)
MCH: 30.2 pg (ref 26.0–34.0)
MCHC: 32.9 g/dL (ref 30.0–36.0)
MCV: 91.8 fL (ref 80.0–100.0)
Platelets: 298 10*3/uL (ref 150–400)
RBC: 4.63 MIL/uL (ref 3.87–5.11)
RDW: 13.3 % (ref 11.5–15.5)
WBC: 17.4 10*3/uL — ABNORMAL HIGH (ref 4.0–10.5)
nRBC: 0 % (ref 0.0–0.2)

## 2020-10-23 LAB — LIPASE, BLOOD: Lipase: 44 U/L (ref 11–51)

## 2020-10-23 LAB — I-STAT BETA HCG BLOOD, ED (MC, WL, AP ONLY): I-stat hCG, quantitative: <5 m[IU]/mL (ref <5)

## 2020-10-23 MED ORDER — LACTATED RINGERS IV SOLN: INTRAVENOUS | Status: DC

## 2020-10-23 MED ORDER — HYDROMORPHONE HCL 1 MG/ML IJ SOLN
1.0000 mg | Freq: Once | INTRAMUSCULAR | Status: AC
Start: 2020-10-23 — End: 2020-10-23
  Administered 2020-10-23: 1 mg via INTRAVENOUS
  Filled 2020-10-23: qty 1

## 2020-10-23 MED ORDER — METOCLOPRAMIDE HCL 5 MG/ML IJ SOLN
5.0000 mg | Freq: Once | INTRAMUSCULAR | Status: AC
  Administered 2020-10-23: 5 mg via INTRAVENOUS
  Filled 2020-10-23: qty 2

## 2020-10-23 MED ORDER — LACTATED RINGERS IV BOLUS: 1000.0000 mL | Freq: Once | INTRAVENOUS | Status: DC

## 2020-10-23 MED ORDER — LACTATED RINGERS IV BOLUS
2000.0000 mL | Freq: Once | INTRAVENOUS | Status: AC
  Administered 2020-10-23: 2000 mL via INTRAVENOUS

## 2020-10-23 MED ORDER — OXYCODONE-ACETAMINOPHEN 5-325 MG PO TABS
1.0000 | ORAL_TABLET | Freq: Four times a day (QID) | ORAL | 0 refills | Status: DC | PRN
Start: 2020-10-23 — End: 2020-11-02

## 2020-10-23 NOTE — ED Provider Notes (Signed)
[place] DEPT Provider Note   CSN: [id number] Arrival date & time: 10/23/20  1009     History Chief Complaint  Patient presents with   Abdominal Pain    Sarah Vasquez is a 40 y.o. female.  40 year old female with history of rheumatoid bowel disease presents with exacerbation of her Crohn's disease.  States that she had abdominal CT done 2 days ago and those results were reviewed which did show active inflammation.  Was started on corticosteroids.  States that she has had nausea and some diffuse abdominal cramping.  Also notes diarrhea which has been coffee-ground and bloody at times.  No fever.  Patient called the office and told to come here. states when she gets this way she used to responds well to IV fluids and pain medication as well as antinausea medication.      Past Medical History:  Diagnosis Date   Anxiety    Crohn disease ([place])    Depression    Kidney anomaly, congenital    OCD (obsessive compulsive disorder)    UC (ulcerative colitis) ([place])     There are no problems to display for this patient.   Past Surgical History:  Procedure Laterality Date   COLON SURGERY     FINGER SURGERY     HIP SURGERY       OB History   No obstetric history on file.     History reviewed. No pertinent family history.  Social History   Tobacco Use   Smoking status: Every Day    Packs/day: 1.00    Types: Cigarettes   Smokeless tobacco: Never  Substance Use Topics   Alcohol use: Yes    Comment: occ   Drug use: Not Currently    Home Medications Prior to Admission medications   Medication Sig Start Date End Date Taking? Authorizing Provider  Apremilast (OTEZLA) 30 MG TABS Take 1 tablet (30 mg total) by mouth in the morning and at bedtime. 07/18/19   Carmin Muskrat, MD  diclofenac Sodium (VOLTAREN) 1 % GEL Apply 4 g topically 4 (four) times daily. 07/25/20   Palumbo, April, MD  dicyclomine (BENTYL) 20 MG tablet Take 1 tablet (20 mg  total) by mouth 2 (two) times daily. 07/18/19   Carmin Muskrat, MD  dicyclomine (BENTYL) 20 MG tablet Take 1 tablet (20 mg total) by mouth 2 (two) times daily for 20 days. 05/18/20 06/07/20  Wyvonnia Dusky, MD  diphenhydrAMINE (SOMINEX) 25 MG tablet Take 1 tablet by mouth daily as needed for itching.     [provider]  diphenoxylate-atropine (LOMOTIL) 2.5-0.025 MG tablet Take 1-2 tablets by mouth 4 (four) times daily as needed for diarrhea or loose stools. 12/25/19   Orpah Greek, MD  FLUoxetine (PROZAC) 40 MG capsule Take 1 capsule (40 mg total) by mouth daily. 07/18/19   Carmin Muskrat, MD  fluticasone [facility]) 50 MCG/ACT nasal spray Place 2 sprays into both nostrils daily for 14 days. 10/06/19 10/20/19  Tedd Sias, PA  HYDROcodone-acetaminophen (NORCO/VICODIN) 5-325 MG tablet Take 1 tablet by mouth every 4 (four) hours as needed for moderate pain. 12/25/19   Orpah Greek, MD  lidocaine (LIDODERM) 5 % Place 1 patch onto the skin daily. Remove & Discard patch within 12 hours or as directed by MD 07/25/20   Randal Buba, April, MD  methocarbamol (ROBAXIN) 500 MG tablet Take 1 tablet (500 mg total) by mouth 2 (two) times daily. 07/25/20   Palumbo, April, MD  ondansetron [facility]  ODT) 4 MG disintegrating tablet Take 1 tablet (4 mg total) by mouth every 8 (eight) hours as needed for nausea or vomiting. 03/11/20   Nuala Alpha A, PA-C  ondansetron (ZOFRAN) 4 MG tablet Take 1 tablet (4 mg total) by mouth every 8 (eight) hours as needed for up to 15 doses for nausea or vomiting. 05/18/20   Wyvonnia Dusky, MD  oxyCODONE-acetaminophen (PERCOCET/ROXICET) 5-325 MG tablet Take 1 tablet by mouth every 6 (six) hours as needed for up to 12 doses for severe pain. 05/18/20   Wyvonnia Dusky, MD  potassium chloride (KLOR-CON) 10 MEQ tablet Take 1 tablet (10 mEq total) by mouth daily for 30 doses. 05/18/20 06/17/20  Wyvonnia Dusky, MD    Allergies    Morphine and related, Penicillin  g, Adalimumab, Azathioprine, and Infliximab  Review of Systems   Review of Systems  All other systems reviewed and are negative.  Physical Exam Updated Vital Signs BP (!) 145/96 (BP Location: Right Arm)   Pulse 82   Temp 98.5 F (36.9 C) (Oral)   Resp 16   SpO2 100%   Physical Exam Vitals and nursing note reviewed.  Constitutional:      General: She is not in acute distress.    Appearance: Normal appearance. She is well-developed. She is not toxic-appearing.  HENT:     Head: Normocephalic and atraumatic.  Eyes:     General: Lids are normal.     Conjunctiva/sclera: Conjunctivae normal.     Pupils: Pupils are equal, round, and reactive to light.  Neck:     Thyroid: No thyroid mass.     Trachea: No tracheal deviation.  Cardiovascular:     Rate and Rhythm: Normal rate and regular rhythm.     Heart sounds: Normal heart sounds. No murmur heard.   No gallop.  Pulmonary:     Effort: Pulmonary effort is normal. No respiratory distress.     Breath sounds: Normal breath sounds. No stridor. No decreased breath sounds, wheezing, rhonchi or rales.  Abdominal:     General: There is no distension.     Palpations: Abdomen is soft.     Tenderness: There is generalized abdominal tenderness. There is no rebound.  Musculoskeletal:        General: No tenderness. Normal range of motion.     Cervical back: Normal range of motion and neck supple.  Skin:    General: Skin is warm and dry.     Findings: No abrasion or rash.  Neurological:     Mental Status: She is alert and oriented to person, place, and time. Mental status is at baseline.     GCS: GCS eye subscore is 4. GCS verbal subscore is 5. GCS motor subscore is 6.     Cranial Nerves: Cranial nerves are intact. No cranial nerve deficit.     Sensory: No sensory deficit.     Motor: Motor function is intact.  Psychiatric:        Attention and Perception: Attention normal.        Speech: Speech normal.        Behavior: Behavior normal.     ED Results / Procedures / Treatments   Labs (all labs ordered are listed, but only abnormal results are displayed) Labs Reviewed  LIPASE, BLOOD  COMPREHENSIVE METABOLIC PANEL  CBC  URINALYSIS, ROUTINE W REFLEX MICROSCOPIC  I-STAT BETA HCG BLOOD, ED ([facility], [facility], [facility] ONLY)    EKG None  Radiology No results found.  Procedures Procedures   Medications Ordered in ED Medications  lactated ringers infusion (has no administration in time range)  metoCLOPramide (REGLAN) injection 5 mg (has no administration in time range)  HYDROmorphone (DILAUDID) injection 1 mg (has no administration in time range)  lactated ringers bolus 2,000 mL (has no administration in time range)    ED Course  I have reviewed the triage vital signs and the nursing notes.  Pertinent labs & imaging results that were available during my care of the patient were reviewed by me and considered in my medical decision making (see chart for details).    MDM Rules/Calculators/A&P                           Patient medicated here for pain and feels better.  She is also given IV hydration.  Patient's Crohn's exacerbation is being managed by her gastroenterologist.  Will discharge home Final Clinical Impression(s) / ED Diagnoses Final diagnoses:  None    Rx / DC Orders ED Discharge Orders     None        Lacretia Leigh, MD 10/23/20 1526

## 2020-10-23 NOTE — ED Triage Notes (Signed)
Pt arrived via POV, c/o right upper quad abd pain, n/v and diarrhea. States crohns flare.

## 2020-11-02 ENCOUNTER — Encounter (HOSPITAL_COMMUNITY): Payer: Self-pay

## 2020-11-02 ENCOUNTER — Emergency Department (HOSPITAL_COMMUNITY)
Admission: EM | Admit: 2020-11-02 | Discharge: 2020-11-02 | Disposition: A | Discharge status: Home / Self Care | Payer: 59 | Attending: Emergency Medicine | Admitting: Emergency Medicine

## 2020-11-02 ENCOUNTER — Emergency Department (HOSPITAL_COMMUNITY): Payer: 59

## 2020-11-02 ENCOUNTER — Other Ambulatory Visit: Payer: Self-pay

## 2020-11-02 DIAGNOSIS — K50919 Crohn's disease, unspecified, with unspecified complications: Secondary | ICD-10-CM | POA: Diagnosis not present

## 2020-11-02 DIAGNOSIS — F17210 Nicotine dependence, cigarettes, uncomplicated: Secondary | ICD-10-CM | POA: Insufficient documentation

## 2020-11-02 DIAGNOSIS — E876 Hypokalemia: Secondary | ICD-10-CM | POA: Diagnosis not present

## 2020-11-02 DIAGNOSIS — D72820 Lymphocytosis (symptomatic): Secondary | ICD-10-CM | POA: Diagnosis not present

## 2020-11-02 DIAGNOSIS — K50918 Crohn's disease, unspecified, with other complication: Secondary | ICD-10-CM

## 2020-11-02 DIAGNOSIS — R109 Unspecified abdominal pain: Secondary | ICD-10-CM | POA: Diagnosis present

## 2020-11-02 DIAGNOSIS — R1010 Upper abdominal pain, unspecified: Secondary | ICD-10-CM

## 2020-11-02 LAB — COMPREHENSIVE METABOLIC PANEL
ALT: 17 U/L (ref 0–44)
AST: 14 U/L — ABNORMAL LOW (ref 15–41)
Albumin: 3.6 g/dL (ref 3.5–5.0)
Alkaline Phosphatase: 72 U/L (ref 38–126)
Anion gap: 9 (ref 5–15)
BUN: 15 mg/dL (ref 6–20)
CO2: 24 mmol/L (ref 22–32)
Calcium: 8.6 mg/dL — ABNORMAL LOW (ref 8.9–10.3)
Chloride: 107 mmol/L (ref 98–111)
Creatinine, Ser: 0.79 mg/dL (ref 0.44–1.00)
GFR, Estimated: >60 mL/min (ref >60)
Glucose, Bld: 118 mg/dL — ABNORMAL HIGH (ref 70–99)
Potassium: 3 mmol/L — ABNORMAL LOW (ref 3.5–5.1)
Sodium: 140 mmol/L (ref 135–145)
Total Bilirubin: 0.3 mg/dL (ref 0.3–1.2)
Total Protein: 6.7 g/dL (ref 6.5–8.1)

## 2020-11-02 LAB — CBC WITH DIFFERENTIAL/PLATELET
Abs Immature Granulocytes: 0.26 10*3/uL — ABNORMAL HIGH (ref 0.00–0.07)
Basophils Absolute: 0.1 10*3/uL (ref 0.0–0.1)
Basophils Relative: 0 %
Eosinophils Absolute: 0.1 10*3/uL (ref 0.0–0.5)
Eosinophils Relative: 1 %
HCT: 42.5 % (ref 36.0–46.0)
Hemoglobin: 13.8 g/dL (ref 12.0–15.0)
Immature Granulocytes: 1 %
Lymphocytes Relative: 35 %
Lymphs Abs: 8.2 10*3/uL — ABNORMAL HIGH (ref 0.7–4.0)
MCH: 30 pg (ref 26.0–34.0)
MCHC: 32.5 g/dL (ref 30.0–36.0)
MCV: 92.4 fL (ref 80.0–100.0)
Monocytes Absolute: 1 10*3/uL (ref 0.1–1.0)
Monocytes Relative: 4 %
Neutro Abs: 13.6 10*3/uL — ABNORMAL HIGH (ref 1.7–7.7)
Neutrophils Relative %: 59 %
Platelets: 321 10*3/uL (ref 150–400)
RBC: 4.6 MIL/uL (ref 3.87–5.11)
RDW: 13.8 % (ref 11.5–15.5)
WBC: 23.2 10*3/uL — ABNORMAL HIGH (ref 4.0–10.5)
nRBC: 0 % (ref 0.0–0.2)

## 2020-11-02 LAB — MAGNESIUM: Magnesium: 2 mg/dL (ref 1.7–2.4)

## 2020-11-02 LAB — HCG, SERUM, QUALITATIVE: Preg, Serum: NEGATIVE

## 2020-11-02 LAB — LIPASE, BLOOD: Lipase: 47 U/L (ref 11–51)

## 2020-11-02 MED ORDER — OXYCODONE-ACETAMINOPHEN 5-325 MG PO TABS
1.0000 | ORAL_TABLET | Freq: Three times a day (TID) | ORAL | 0 refills | Status: AC | PRN
Start: 2020-11-02 — End: ?

## 2020-11-02 MED ORDER — POTASSIUM CHLORIDE 10 MEQ/100ML IV SOLN
10.0000 meq | INTRAVENOUS | Status: AC
  Administered 2020-11-02 (×2): 10 meq via INTRAVENOUS
  Filled 2020-11-02 (×2): qty 100

## 2020-11-02 MED ORDER — METHYLPREDNISOLONE SODIUM SUCC 125 MG IJ SOLR
125.0000 mg | Freq: Once | INTRAMUSCULAR | Status: AC
  Administered 2020-11-02: 125 mg via INTRAVENOUS
  Filled 2020-11-02: qty 2

## 2020-11-02 MED ORDER — POTASSIUM CHLORIDE CRYS ER 20 MEQ PO TBCR
40.0000 meq | EXTENDED_RELEASE_TABLET | Freq: Once | ORAL | Status: AC
  Administered 2020-11-02: 40 meq via ORAL
  Filled 2020-11-02: qty 2

## 2020-11-02 MED ORDER — HYDROMORPHONE HCL 1 MG/ML IJ SOLN
1.0000 mg | Freq: Once | INTRAMUSCULAR | Status: AC
  Administered 2020-11-02: 1 mg via INTRAVENOUS
  Filled 2020-11-02: qty 1

## 2020-11-02 MED ORDER — IOHEXOL 350 MG/ML SOLN
100.0000 mL | Freq: Once | INTRAVENOUS | Status: AC | PRN
  Administered 2020-11-02: 80 mL via INTRAVENOUS

## 2020-11-02 MED ORDER — HYDROMORPHONE HCL 1 MG/ML IJ SOLN
1.0000 mg | Freq: Once | INTRAMUSCULAR | Status: AC
Start: 2020-11-02 — End: 2020-11-02
  Administered 2020-11-02: 1 mg via INTRAVENOUS
  Filled 2020-11-02: qty 1

## 2020-11-02 MED ORDER — LACTATED RINGERS IV BOLUS
1000.0000 mL | Freq: Once | INTRAVENOUS | Status: AC
  Administered 2020-11-02: 1000 mL via INTRAVENOUS

## 2020-11-02 NOTE — ED Triage Notes (Signed)
Patient c/o LUQ abdominal pain and reports a history of Crohn's. Patient denies any N/v/d.

## 2020-11-02 NOTE — ED Notes (Signed)
Called lab to add on Mg ordered

## 2020-11-02 NOTE — ED Provider Notes (Signed)
Emergency Medicine Provider Triage Evaluation Note  Sarah Vasquez , a 40 y.o. female  was evaluated in triage.  Pt complains of acute on chronic abdominal pain.  History of Crohn's.  On steroids for this.  Associated nausea, but able to control without home antiemetics.  No fevers.  No change in stool.  Sees [facility].  Review of Systems  Positive: Abd pain, nausea Negative: fever  Physical Exam  BP (!) 142/108 (BP Location: Right Arm)   Pulse 84   Temp 99.1 F (37.3 C) (Oral)   Resp 16   Ht 5' 5"  (1.651 m)   Wt 86.2 kg   SpO2 100%   BMI 31.62 kg/m  Gen:   Awake, no distress   Resp:  Normal effort  MSK:   Moves extremities without difficulty  Other:  Tenderness palpation of upper abdomen, mostly in epigastric region.  No rigidity or distention.  Medical Decision Making  Medically screening exam initiated at 12:43 PM.  Appropriate orders placed.  Glady Haddaway was informed that the remainder of the evaluation will be completed by another provider, this initial triage assessment does not replace that evaluation, and the importance of remaining in the ED until their evaluation is complete.  labs   Franchot Heidelberg, PA-C 11/02/20 1244    Lajean Saver, MD 11/02/20 1616

## 2020-11-02 NOTE — ED Provider Notes (Signed)
[facility] DEPT Provider Note   CSN: [id number] Arrival date & time: 11/02/20  1218     History Chief Complaint  Patient presents with   Abdominal Pain    Sarah Vasquez is a 40 y.o. female.  HPI Patient is a 40 year old female with a history of Crohn's disease on 40 mg of prednisone daily who presents to the emergency department due to worsening abdominal pain.  Patient states that she has a history of recurrent abdominal pain due to her Crohn's and her symptoms have been worsening over the past couple of weeks.  Reports associated alternating diarrhea and constipation.  She notes that some days she will have no bowel movements and others she will have 12-15 bowel movements.  She states her stool has recently changed colors and is now more yellow and softer in consistency.  Denies any hematochezia or melena.  Reports associated nausea without vomiting.  Denies any chest pain, shortness of breath, urinary complaints.  States that she moved to [place] from [place] about 1 year ago.  She is currently not on a biologic because of a significant amount of allergies to biologics but states that she was on biologics in [place] which improved the frequency of her flareups significantly.  She has a f/u appointment with [facility] in 1 week.    Past Medical History:  Diagnosis Date   Anxiety    Crohn disease ([place])    Depression    Kidney anomaly, congenital    OCD (obsessive compulsive disorder)    UC (ulcerative colitis) ([place])     There are no problems to display for this patient.   Past Surgical History:  Procedure Laterality Date   COLON SURGERY     FINGER SURGERY     HIP SURGERY       OB History   No obstetric history on file.     History reviewed. No pertinent family history.  Social History   Tobacco Use   Smoking status: Every Day    Packs/day: 1.00    Types: Cigarettes   Smokeless tobacco: Never  Vaping Use   Vaping Use:  Never used  Substance Use Topics   Alcohol use: Yes    Comment: occ   Drug use: Not Currently    Home Medications Prior to Admission medications   Medication Sig Start Date End Date Taking? Authorizing Provider  oxyCODONE-acetaminophen (PERCOCET/ROXICET) 5-325 MG tablet Take 1 tablet by mouth every 8 (eight) hours as needed for severe pain. 11/02/20  Yes Rayna Sexton, PA-C  Apremilast (OTEZLA) 30 MG TABS Take 1 tablet (30 mg total) by mouth in the morning and at bedtime. 07/18/19   Carmin Muskrat, MD  diclofenac Sodium (VOLTAREN) 1 % GEL Apply 4 g topically 4 (four) times daily. 07/25/20   Palumbo, April, MD  dicyclomine (BENTYL) 20 MG tablet Take 1 tablet (20 mg total) by mouth 2 (two) times daily. 07/18/19   Carmin Muskrat, MD  dicyclomine (BENTYL) 20 MG tablet Take 1 tablet (20 mg total) by mouth 2 (two) times daily for 20 days. 05/18/20 06/07/20  Wyvonnia Dusky, MD  diphenhydrAMINE (SOMINEX) 25 MG tablet Take 1 tablet by mouth daily as needed for itching.     [provider]  diphenoxylate-atropine (LOMOTIL) 2.5-0.025 MG tablet Take 1-2 tablets by mouth 4 (four) times daily as needed for diarrhea or loose stools. 12/25/19   Orpah Greek, MD  FLUoxetine (PROZAC) 40 MG capsule Take 1 capsule (40 mg total)  by mouth daily. 07/18/19   Carmin Muskrat, MD  fluticasone [facility]) 50 MCG/ACT nasal spray Place 2 sprays into both nostrils daily for 14 days. 10/06/19 10/20/19  Tedd Sias, PA  HYDROcodone-acetaminophen (NORCO/VICODIN) 5-325 MG tablet Take 1 tablet by mouth every 4 (four) hours as needed for moderate pain. 12/25/19   Orpah Greek, MD  lidocaine (LIDODERM) 5 % Place 1 patch onto the skin daily. Remove & Discard patch within 12 hours or as directed by MD 07/25/20   Randal Buba, April, MD  methocarbamol (ROBAXIN) 500 MG tablet Take 1 tablet (500 mg total) by mouth 2 (two) times daily. 07/25/20   Palumbo, April, MD  ondansetron (ZOFRAN ODT) 4 MG disintegrating  tablet Take 1 tablet (4 mg total) by mouth every 8 (eight) hours as needed for nausea or vomiting. 03/11/20   Nuala Alpha A, PA-C  ondansetron (ZOFRAN) 4 MG tablet Take 1 tablet (4 mg total) by mouth every 8 (eight) hours as needed for up to 15 doses for nausea or vomiting. 05/18/20   Trifan, Carola Rhine, MD  potassium chloride (KLOR-CON) 10 MEQ tablet Take 1 tablet (10 mEq total) by mouth daily for 30 doses. 05/18/20 06/17/20  Wyvonnia Dusky, MD    Allergies    Morphine and related, Penicillin g, Adalimumab, Azathioprine, and Infliximab  Review of Systems   Review of Systems  All other systems reviewed and are negative. Ten systems reviewed and are negative for acute change, except as noted in the HPI.   Physical Exam Updated Vital Signs BP 136/84   Pulse 67   Temp 99.1 F (37.3 C) (Oral)   Resp 20   Ht 5' 5"  (1.651 m)   Wt 86.2 kg   SpO2 91%   BMI 31.62 kg/m   Physical Exam Vitals and nursing note reviewed.  Constitutional:      General: She is not in acute distress.    Appearance: Normal appearance. She is well-developed. She is not ill-appearing, toxic-appearing or diaphoretic.  HENT:     Head: Normocephalic and atraumatic.     Right Ear: External ear normal.     Left Ear: External ear normal.     Nose: Nose normal.     Mouth/Throat:     Mouth: Mucous membranes are moist.     Pharynx: Oropharynx is clear. No oropharyngeal exudate or posterior oropharyngeal erythema.  Eyes:     Extraocular Movements: Extraocular movements intact.  Cardiovascular:     Rate and Rhythm: Normal rate and regular rhythm.     Pulses: Normal pulses.     Heart sounds: Normal heart sounds. No murmur heard.   No friction rub. No gallop.  Pulmonary:     Effort: Pulmonary effort is normal. No respiratory distress.     Breath sounds: Normal breath sounds. No stridor. No wheezing, rhonchi or rales.  Abdominal:     General: Abdomen is flat and protuberant.     Palpations: Abdomen is soft.      Tenderness: There is abdominal tenderness in the right upper quadrant, epigastric area and left upper quadrant.  Musculoskeletal:        General: Normal range of motion.     Cervical back: Normal range of motion and neck supple. No tenderness.  Skin:    General: Skin is warm and dry.  Neurological:     General: No focal deficit present.     Mental Status: She is alert and oriented to person, place, and time.  Psychiatric:  Mood and Affect: Mood normal.        Behavior: Behavior normal.   ED Results / Procedures / Treatments   Labs (all labs ordered are listed, but only abnormal results are displayed) Labs Reviewed  CBC WITH DIFFERENTIAL/PLATELET - Abnormal; Notable for the following components:      Result Value   WBC 23.2 (*)    Neutro Abs 13.6 (*)    Lymphs Abs 8.2 (*)    Abs Immature Granulocytes 0.26 (*)    All other components within normal limits  COMPREHENSIVE METABOLIC PANEL - Abnormal; Notable for the following components:   Potassium 3.0 (*)    Glucose, Bld 118 (*)    Calcium 8.6 (*)    AST 14 (*)    All other components within normal limits  LIPASE, BLOOD  HCG, SERUM, QUALITATIVE  MAGNESIUM   EKG None  Radiology CT ABDOMEN PELVIS W CONTRAST  Result Date: 11/02/2020 CLINICAL DATA:  Nausea and vomiting, abdominal pain. History of Crohn's disease. EXAM: CT ABDOMEN AND PELVIS WITH CONTRAST TECHNIQUE: Multidetector CT imaging of the abdomen and pelvis was performed using the standard protocol following bolus administration of intravenous contrast. CONTRAST:  40m OMNIPAQUE IOHEXOL 350 MG/ML SOLN COMPARISON:  CT 10/21/2020 FINDINGS: Lower chest: Lung bases are clear. Hepatobiliary: No focal hepatic lesion. No biliary duct dilatation. Common bile duct is normal. Pancreas: Pancreas is normal. No ductal dilatation. No pancreatic inflammation. Spleen: Normal spleen Adrenals/urinary tract: Adrenal glands normal. Solitary RIGHT kidney. No hydronephrosis or ureteral  obstruction. Bladder normal Stomach/Bowel: Stomach, duodenum are normal. Jejunum and ileum are normal. Enteric colonic anastomosis with the ascending colon. The distal ileum leading up to the enteric colonic anastomosis demonstrates bowel wall thickening and luminal narrowing similar to comparison exam. The luminal narrowing and bowel wall thickening occurs over approximately 7 cm segment (image 64/2). There is less inflammation/submucosal edema on current exam compared to prior. No evidence of active inflammation. No fistula or abscess. Ascending transverse and descending colon are normal. Rectosigmoid Vascular/Lymphatic: Abdominal aorta is normal caliber. No periportal or retroperitoneal adenopathy. No pelvic adenopathy. Reproductive: Uterus and adnexa unremarkable. 11 mm low-density cystic lesion of the RIGHT ovary. Other: No free fluid. Musculoskeletal: No aggressive osseous lesion. IMPRESSION: 1. Improvement inflammation of the distal ileum compared to CT 10/21/2020. 2. Persistent luminal narrowing and circumferential mucosal thickening over a 7 cm segment leading up to the enteric colonic anastomosis. No evidence of active inflammatory bowel disease. 3. No evidence of fistula or abscess. Electronically Signed   By: SSuzy BouchardM.D.   On: 11/02/2020 16:14    Procedures Procedures   Medications Ordered in ED Medications  potassium chloride 10 mEq in 100 mL IVPB (10 mEq Intravenous New Bag/Given 11/02/20 1946)  iohexol (OMNIPAQUE) 350 MG/ML injection 100 mL (80 mLs Intravenous Contrast Given 11/02/20 1534)  HYDROmorphone (DILAUDID) injection 1 mg (1 mg Intravenous Given 11/02/20 1556)  lactated ringers bolus 1,000 mL (0 mLs Intravenous Stopped 11/02/20 1832)  potassium chloride SA (KLOR-CON) CR tablet 40 mEq (40 mEq Oral Given 11/02/20 1829)  methylPREDNISolone sodium succinate (SOLU-MEDROL) 125 mg/2 mL injection 125 mg (125 mg Intravenous Given 11/02/20 1828)  HYDROmorphone (DILAUDID) injection 1 mg  (1 mg Intravenous Given 11/02/20 1829)    ED Course  I have reviewed the triage vital signs and the nursing notes.  Pertinent labs & imaging results that were available during my care of the patient were reviewed by me and considered in my medical decision making (see chart for details).  Clinical Course as of 11/02/20 2045  Mon Nov 02, 2020  1748 Patient discussed with [facility] gastroenterology.  Agrees with current plan.  Would also recommend that we give patient a single IV dose of Solu-Medrol here in the emergency department.  DC with pain management medications.  Patient has an appointment with gastroenterology in 1 week. [LJ]    Clinical Course User Index [LJ] Rayna Sexton, PA-C   MDM Rules/Calculators/A&P                          Pt is a 40 y.o. female with a history of Crohn's disease who presents to the emergency department due to worsening upper abdominal pain.  Labs: CBC with a white blood cell count of 23.2, neutrophils of 13.6, lymphocytes of 8.2, absolute immature granulocytes of 0.26. CMP with potassium of 3, glucose of 118, calcium of 8.6, AST of 14. Lipase of 47. Pregnancy test is negative. Magnesium of 2.  Imaging: CT of the abdomen/pelvis with contrast shows 1. Improvement inflammation of the distal ileum compared to CT 10/21/2020. 2. Persistent luminal narrowing and circumferential mucosal thickening over a 7 cm segment leading up to the enteric colonic anastomosis. No evidence of active inflammatory bowel disease. 3. No evidence of fistula or abscess.   I, Rayna Sexton, PA-C, personally reviewed and evaluated these images and lab results as part of my medical decision-making.  Patient discussed with [facility] gastroenterology.  They agree with our current plan.  They also recommended a single dose of IV Solu-Medrol which was given to the patient.  Patient has an appointment with their office in 1 week.  Recommended that she continue with her daily dose of  prednisone.  Patient's pain treated with IV Dilaudid.  She notes mild to moderate improvement.  Patient also given IV fluids.  Found to be hypokalemic with a potassium of 3.  Likely due to her recurrent diarrhea.  Patient given Klor-Con as well as IV potassium.  Magnesium within normal limits.  Feel that the patient is stable for discharge at this time and she is agreeable.  Will discharge on a short course of Percocet for breakthrough symptoms.  We discussed safety regarding this medication.  Her questions were answered and she was amicable at the time of discharge.  Note: Portions of this report may have been transcribed using voice recognition software. Every effort was made to ensure accuracy; however, inadvertent computerized transcription errors may be present.   Final Clinical Impression(s) / ED Diagnoses Final diagnoses:  Crohn's disease with other complication, unspecified gastrointestinal tract location [facility])  Hypokalemia  Upper abdominal pain    Rx / DC Orders ED Discharge Orders          Ordered    oxyCODONE-acetaminophen (PERCOCET/ROXICET) 5-325 MG tablet  Every 8 hours PRN        11/02/20 2043             Rayna Sexton, PA-C 11/02/20 2049    Wynona Dove A, DO 11/03/20 [phone]

## 2020-11-02 NOTE — Discharge Instructions (Addendum)
I have prescribed you a strong narcotic called Percocet. Please only take this as prescribed. This medication also has tylenol in it, so please be sure you are not taking more than 3000 mg of tylenol per day. Do not drive or operate heavy machinery after taking this medication. Do not mix it with alcohol.   Please continue to monitor your symptoms closely.  If you develop any new or worsening symptoms please come back to the emergency department.  Please follow-up with your gastroenterologist at your appointment in 1 week.

## 2020-11-17 ENCOUNTER — Emergency Department (HOSPITAL_COMMUNITY)
Admission: EM | Admit: 2020-11-17 | Discharge: 2020-11-17 | Disposition: A | Discharge status: Home / Self Care | Payer: 59 | Attending: Emergency Medicine | Admitting: Emergency Medicine

## 2020-11-17 ENCOUNTER — Emergency Department (HOSPITAL_COMMUNITY): Payer: 59

## 2020-11-17 ENCOUNTER — Encounter (HOSPITAL_COMMUNITY): Payer: Self-pay

## 2020-11-17 ENCOUNTER — Other Ambulatory Visit: Payer: Self-pay

## 2020-11-17 DIAGNOSIS — R197 Diarrhea, unspecified: Secondary | ICD-10-CM | POA: Diagnosis not present

## 2020-11-17 DIAGNOSIS — R1084 Generalized abdominal pain: Secondary | ICD-10-CM | POA: Insufficient documentation

## 2020-11-17 DIAGNOSIS — R110 Nausea: Secondary | ICD-10-CM | POA: Insufficient documentation

## 2020-11-17 DIAGNOSIS — F17210 Nicotine dependence, cigarettes, uncomplicated: Secondary | ICD-10-CM | POA: Diagnosis not present

## 2020-11-17 DIAGNOSIS — R109 Unspecified abdominal pain: Secondary | ICD-10-CM | POA: Diagnosis present

## 2020-11-17 DIAGNOSIS — D72829 Elevated white blood cell count, unspecified: Secondary | ICD-10-CM | POA: Insufficient documentation

## 2020-11-17 LAB — COMPREHENSIVE METABOLIC PANEL
ALT: 21 U/L (ref 0–44)
AST: 14 U/L — ABNORMAL LOW (ref 15–41)
Albumin: 3.9 g/dL (ref 3.5–5.0)
Alkaline Phosphatase: 78 U/L (ref 38–126)
Anion gap: 6 (ref 5–15)
BUN: 13 mg/dL (ref 6–20)
CO2: 23 mmol/L (ref 22–32)
Calcium: 8.5 mg/dL — ABNORMAL LOW (ref 8.9–10.3)
Chloride: 108 mmol/L (ref 98–111)
Creatinine, Ser: 0.82 mg/dL (ref 0.44–1.00)
GFR, Estimated: >60 mL/min (ref >60)
Glucose, Bld: 105 mg/dL — ABNORMAL HIGH (ref 70–99)
Potassium: 3.5 mmol/L (ref 3.5–5.1)
Sodium: 137 mmol/L (ref 135–145)
Total Bilirubin: 0.6 mg/dL (ref 0.3–1.2)
Total Protein: 6.9 g/dL (ref 6.5–8.1)

## 2020-11-17 LAB — URINALYSIS, ROUTINE W REFLEX MICROSCOPIC
Bilirubin Urine: NEGATIVE
Glucose, UA: NEGATIVE mg/dL
Hgb urine dipstick: NEGATIVE
Ketones, ur: NEGATIVE mg/dL
Leukocytes,Ua: NEGATIVE
Nitrite: NEGATIVE
Protein, ur: NEGATIVE mg/dL
Specific Gravity, Urine: 1.013 (ref 1.005–1.030)
pH: 5 (ref 5.0–8.0)

## 2020-11-17 LAB — CBC
HCT: 43.2 % (ref 36.0–46.0)
Hemoglobin: 14.5 g/dL (ref 12.0–15.0)
MCH: 31 pg (ref 26.0–34.0)
MCHC: 33.6 g/dL (ref 30.0–36.0)
MCV: 92.5 fL (ref 80.0–100.0)
Platelets: 282 10*3/uL (ref 150–400)
RBC: 4.67 MIL/uL (ref 3.87–5.11)
RDW: 14.4 % (ref 11.5–15.5)
WBC: 20.5 10*3/uL — ABNORMAL HIGH (ref 4.0–10.5)
nRBC: 0 % (ref 0.0–0.2)

## 2020-11-17 LAB — LIPASE, BLOOD: Lipase: 51 U/L (ref 11–51)

## 2020-11-17 LAB — I-STAT BETA HCG BLOOD, ED (MC, WL, AP ONLY): I-stat hCG, quantitative: <5 m[IU]/mL (ref <5)

## 2020-11-17 MED ORDER — IOHEXOL 350 MG/ML SOLN
80.0000 mL | Freq: Once | INTRAVENOUS | Status: AC | PRN
  Administered 2020-11-17: 80 mL via INTRAVENOUS

## 2020-11-17 MED ORDER — LACTATED RINGERS IV BOLUS
1000.0000 mL | Freq: Once | INTRAVENOUS | Status: AC
  Administered 2020-11-17: 1000 mL via INTRAVENOUS

## 2020-11-17 MED ORDER — METOCLOPRAMIDE HCL 10 MG PO TABS: 10.0000 mg | ORAL_TABLET | Freq: Four times a day (QID) | ORAL | 0 refills | Status: AC

## 2020-11-17 MED ORDER — DIPHENHYDRAMINE HCL 50 MG/ML IJ SOLN
12.5000 mg | Freq: Once | INTRAMUSCULAR | Status: AC
  Administered 2020-11-17: 12.5 mg via INTRAVENOUS
  Filled 2020-11-17: qty 1

## 2020-11-17 MED ORDER — HYDROMORPHONE HCL 1 MG/ML IJ SOLN
1.0000 mg | Freq: Once | INTRAMUSCULAR | Status: AC
  Administered 2020-11-17: 1 mg via INTRAVENOUS
  Filled 2020-11-17: qty 1

## 2020-11-17 MED ORDER — METOCLOPRAMIDE HCL 5 MG/ML IJ SOLN
10.0000 mg | Freq: Once | INTRAMUSCULAR | Status: AC
  Administered 2020-11-17: 10 mg via INTRAVENOUS
  Filled 2020-11-17: qty 2

## 2020-11-17 NOTE — ED Notes (Signed)
Pt advised unable to void at this time, provided pt with specimen cup for when able.

## 2020-11-17 NOTE — Discharge Instructions (Addendum)
Please continue taking her home medications.  Please drink plenty of water.  Please follow instructions by your gastroenterologist and follow-up with [facility].  Please continue to take your prednisone.  I have prescribed you Reglan which I recommend you take with Benadryl.  If you are not able to keep down the Reglan you may use Zofran which you have at home.  You may return to the ER for any new or concerning symptoms.

## 2020-11-17 NOTE — ED Provider Notes (Signed)
[place] DEPT Provider Note   CSN: [id number] Arrival date & time: 11/17/20  1053     History Chief Complaint  Patient presents with  . Abdominal Pain  . Diarrhea  . Nausea  . Constipation    Sarah Vasquez is a 40 y.o. female.  HPI Patient is a 40 year old female with past medical history significant for ulcerative colitis, Crohn's, anxiety, depression, OCD  Patient is presented today to the ER with ongoing abdominal pain and states it has been severe and ongoing for the past month.  She has been taking prednisone 40 mg daily for the past month per recommendations of [facility].  She follows with them.  She has been referred to [facility] by them.  She states that her pain has been severe 7-9/10 and more or less constant although waxing and waning.  She endorses constant nausea without vomiting.  States that she has had diarrhea for the past month with intermittent melena for the past month.  Denies any BRBPR.  Denies any fevers.  No chest pain or shortness of breath no other associate symptoms.    Past Medical History:  Diagnosis Date  . Anxiety   . Crohn disease ([place])   . Depression   . Kidney anomaly, congenital   . OCD (obsessive compulsive disorder)   . UC (ulcerative colitis) ([place])     There are no problems to display for this patient.   Past Surgical History:  Procedure Laterality Date  . COLON SURGERY    . FINGER SURGERY    . HIP SURGERY       OB History   No obstetric history on file.     Family History  Family history unknown: Yes    Social History   Tobacco Use  . Smoking status: Every Day    Packs/day: 1.00    Types: Cigarettes  . Smokeless tobacco: Never  Vaping Use  . Vaping Use: Never used  Substance Use Topics  . Alcohol use: Yes    Comment: occ  . Drug use: Not Currently    Home Medications Prior to Admission medications   Medication Sig Start Date End Date Taking? Authorizing  Provider  metoCLOPramide (REGLAN) 10 MG tablet Take 1 tablet (10 mg total) by mouth every 6 (six) hours. 11/17/20  Yes Tymeshia Awan S, PA  Apremilast (OTEZLA) 30 MG TABS Take 1 tablet (30 mg total) by mouth in the morning and at bedtime. 07/18/19   Carmin Muskrat, MD  diclofenac Sodium (VOLTAREN) 1 % GEL Apply 4 g topically 4 (four) times daily. 07/25/20   Palumbo, April, MD  dicyclomine (BENTYL) 20 MG tablet Take 1 tablet (20 mg total) by mouth 2 (two) times daily. 07/18/19   Carmin Muskrat, MD  dicyclomine (BENTYL) 20 MG tablet Take 1 tablet (20 mg total) by mouth 2 (two) times daily for 20 days. 05/18/20 06/07/20  Wyvonnia Dusky, MD  diphenhydrAMINE (SOMINEX) 25 MG tablet Take 1 tablet by mouth daily as needed for itching.     [provider]  diphenoxylate-atropine (LOMOTIL) 2.5-0.025 MG tablet Take 1-2 tablets by mouth 4 (four) times daily as needed for diarrhea or loose stools. 12/25/19   Orpah Greek, MD  FLUoxetine (PROZAC) 40 MG capsule Take 1 capsule (40 mg total) by mouth daily. 07/18/19   Carmin Muskrat, MD  fluticasone [facility]) 50 MCG/ACT nasal spray Place 2 sprays into both nostrils daily for 14 days. 10/06/19 10/20/19  Tedd Sias, PA  HYDROcodone-acetaminophen (NORCO/VICODIN) 5-325 MG tablet Take 1 tablet by mouth every 4 (four) hours as needed for moderate pain. 12/25/19   Orpah Greek, MD  lidocaine (LIDODERM) 5 % Place 1 patch onto the skin daily. Remove & Discard patch within 12 hours or as directed by MD 07/25/20   Randal Buba, April, MD  methocarbamol (ROBAXIN) 500 MG tablet Take 1 tablet (500 mg total) by mouth 2 (two) times daily. 07/25/20   Palumbo, April, MD  ondansetron (ZOFRAN ODT) 4 MG disintegrating tablet Take 1 tablet (4 mg total) by mouth every 8 (eight) hours as needed for nausea or vomiting. 03/11/20   Nuala Alpha A, PA-C  ondansetron (ZOFRAN) 4 MG tablet Take 1 tablet (4 mg total) by mouth every 8 (eight) hours as needed for up to 15  doses for nausea or vomiting. 05/18/20   Wyvonnia Dusky, MD  oxyCODONE-acetaminophen (PERCOCET/ROXICET) 5-325 MG tablet Take 1 tablet by mouth every 8 (eight) hours as needed for severe pain. 11/02/20   Rayna Sexton, PA-C  potassium chloride (KLOR-CON) 10 MEQ tablet Take 1 tablet (10 mEq total) by mouth daily for 30 doses. 05/18/20 06/17/20  Wyvonnia Dusky, MD    Allergies    Morphine and related, Penicillin g, Adalimumab, Azathioprine, and Infliximab  Review of Systems   Review of Systems  Constitutional:  Positive for fatigue. Negative for chills and fever.  HENT:  Negative for congestion.   Eyes:  Negative for pain.  Respiratory:  Negative for cough and shortness of breath.   Cardiovascular:  Negative for chest pain and leg swelling.  Gastrointestinal:  Positive for abdominal pain, blood in stool, diarrhea and nausea. Negative for vomiting.  Genitourinary:  Negative for dysuria.  Musculoskeletal:  Negative for myalgias.  Skin:  Negative for rash.  Neurological:  Negative for dizziness and headaches.   Physical Exam Updated Vital Signs BP (!) 155/107   Pulse 68   Temp 97.8 F (36.6 C) (Oral)   Resp 16   Ht 5' 5"  (1.651 m)   Wt 89.8 kg   SpO2 100%   BMI 32.95 kg/m   Physical Exam Vitals and nursing note reviewed.  Constitutional:      Appearance: She is obese.  HENT:     Head: Normocephalic and atraumatic.     Nose: Nose normal.  Eyes:     General: No scleral icterus. Cardiovascular:     Rate and Rhythm: Normal rate and regular rhythm.     Pulses: Normal pulses.     Heart sounds: Normal heart sounds.  Pulmonary:     Effort: Pulmonary effort is normal. No respiratory distress.     Breath sounds: No wheezing.  Abdominal:     Palpations: Abdomen is soft.     Tenderness: There is abdominal tenderness.     Comments: Diffuse tenderness to palpation without guarding or rebound.  Patient unable to tolerate deep palpation.  Musculoskeletal:     Cervical back:  Normal range of motion.     Right lower leg: No edema.     Left lower leg: No edema.  Skin:    General: Skin is warm and dry.     Capillary Refill: Capillary refill takes less than 2 seconds.  Neurological:     Mental Status: She is alert. Mental status is at baseline.  Psychiatric:        Mood and Affect: Mood normal.        Behavior: Behavior normal.    ED Results / Procedures /  Treatments   Labs (all labs ordered are listed, but only abnormal results are displayed) Labs Reviewed  COMPREHENSIVE METABOLIC PANEL - Abnormal; Notable for the following components:      Result Value   Glucose, Bld 105 (*)    Calcium 8.5 (*)    AST 14 (*)    All other components within normal limits  CBC - Abnormal; Notable for the following components:   WBC 20.5 (*)    All other components within normal limits  LIPASE, BLOOD  URINALYSIS, ROUTINE W REFLEX MICROSCOPIC  I-STAT BETA HCG BLOOD, ED ([facility], [facility], [facility] ONLY)    EKG None  Radiology CT ABDOMEN PELVIS W CONTRAST  Result Date: 11/17/2020 CLINICAL DATA:  Abdominal pain. History of Crohn's disease. Blood in the stool. EXAM: CT ABDOMEN AND PELVIS WITH CONTRAST TECHNIQUE: Multidetector CT imaging of the abdomen and pelvis was performed using the standard protocol following bolus administration of intravenous contrast. CONTRAST:  40m OMNIPAQUE IOHEXOL 350 MG/ML SOLN COMPARISON:  Multiple exams, including 11/02/2020 FINDINGS: Lower chest: Unremarkable Hepatobiliary: Gallbladder not observed, presumed surgically absent or collapsed. No biliary dilatation. The liver appears unremarkable. Pancreas: Unremarkable Spleen: Unremarkable Adrenals/Urinary Tract: Both adrenal glands appear unremarkable. Absent left kidney. Punctate densities along the calices on the right probably represent early contrast excretion, less likely to be tiny punctate nonobstructive renal calculi. No calculi were visible on the recent CT from 11/02/2020 which is part of why I suspect  that this simply represents some early excretion. Stomach/Bowel: Circumferential wall thickening in the distal 7 cm of small bowel extending to the enterocolic anastomosis. No abscess or extraluminal gas. No other regions of substantial bowel wall thickening. No abscess identified. Vascular/Lymphatic: Minimal aortoiliac atherosclerotic vascular calcifications. Reproductive: Bicornuate uterus. Small ovarian cysts or follicles not requiring follow up imaging. Other: No supplemental non-categorized findings. Musculoskeletal: Small suspected bone island in the T10 vertebral body eccentric to the right. IMPRESSION: 1. Circumferential transmural wall thickening in the distal 7 cm of the ileum extending to the enterocolic anastomosis, very similar appearance to the 11/02/2020 exam. No abscess or extraluminal gas. No additional regions of inflamed bowel are identified. 2.  Aortic Atherosclerosis (ICD10-I70.0). 3. Bicornuate uterus. 4. Absent left kidney. 5. Nonvisualization of the gallbladder, presumed contracted or surgically absent. Electronically Signed   By: WVan ClinesM.D.   On: 11/17/2020 18:46    Procedures Procedures   Medications Ordered in ED Medications  iohexol (OMNIPAQUE) 350 MG/ML injection 80 mL (80 mLs Intravenous Contrast Given 11/17/20 1756)  HYDROmorphone (DILAUDID) injection 1 mg (1 mg Intravenous Given 11/17/20 2134)  lactated ringers bolus 1,000 mL (0 mLs Intravenous Stopped 11/17/20 2211)  metoCLOPramide (REGLAN) injection 10 mg (10 mg Intravenous Given 11/17/20 2134)  diphenhydrAMINE (BENADRYL) injection 12.5 mg (12.5 mg Intravenous Given 11/17/20 2134)    ED Course  I have reviewed the triage vital signs and the nursing notes.  Pertinent labs & imaging results that were available during my care of the patient were reviewed by me and considered in my medical decision making (see chart for details).  Clinical Course as of 11/18/20 1[place], 2022  2013 Discussed  with Dr[facility]ita Fujitaof [facility].  For him this patient is having ongoing symptoms for the past year questionably unrelated to Crohn's or ulcerative colitis.  Disposition ultimately left up with myself however per Dr. OPaulita FujitaCT scan and presentation today is not particularly compelling for admission.  I reviewed patient's labs she has chronic leukocytosis and is currently  on prednisone which is likely the cause of this.  CMP unremarkable.  Lipase within normal limits.  I-STAT Hg negative for pregnancy.  Urinalysis unremarkable.  CT scan with consistent transmural thickening of section of GI tract.  Per Dr. Paulita Fujita this is unchanged from prior studies. [WF]  2015 Discussed with patient she is understanding of need to go home.  We will discharge patient after she receives analgesia, fluids, Reglan and Benadryl. [WF]    Clinical Course User Index [WF] Tedd Sias, PA   MDM Rules/Calculators/A&P                           Patient with Crohn's, ulcerative colitis seems to have chronic abdominal pain has been having pain for the past 1 month.  Now having blood in stool.  Presented to the ER for the symptoms.  Chronic elevated white count 20.5 this is likely due to patient's persistent prednisone use. Lipase within normal esophagitis.  CMP unremarkable.  Urinalysis without evidence of infection i-STAT Hg negative for pregnancy.  CT abdomen pelvis personally reviewed agree with radiology read.  There is an area of bowel thickening.  Discussed with Dr. Paulita Fujita of gastroenterology.  Seems that patient has no new findings on CT today.  From a GI standpoint there is no reason for admission.  Provide patient with analgesia in the form of Dilaudid and Reglan and Benadryl with fluids.  She states she feels somewhat improved.  Is tolerating p.o.  Will discharge patient home at this time with follow-up with [facility] gastroenterology.  Discharged home with Reglan.  Final Clinical Impression(s) / ED  Diagnoses Final diagnoses:  Generalized abdominal pain    Rx / DC Orders ED Discharge Orders          Ordered    metoCLOPramide (REGLAN) 10 MG tablet  Every 6 hours        11/17/20 2045             Tedd Sias, [place] 11/18/20 1733    Carmin Muskrat, MD 11/19/20 1910

## 2020-11-17 NOTE — ED Provider Notes (Signed)
IV placed in right [facility]  First attempt.  ChloraPrep used to clean prior to needle insertion.  20-gauge angiocatheter placed.  Patient [name] procedure well.  Tegaderm used to secure.  Tape used as well.  Flushed 10 cc without resistance.  Blood draw successful as well.   Sarah Vasquez [place], [place] 11/17/20 1730    Daleen Bo, MD 11/18/20 1623

## 2020-11-17 NOTE — ED Provider Notes (Signed)
Emergency Medicine Provider Triage Evaluation Note  Sarah Vasquez , a 40 y.o. female  was evaluated in triage.  Pt complains of ongoing severe abd pain. States is on 39m daily for 1 month.  Severe 7-9/10 pain constant. Pain is not worse with eating.  Nausea without vomiting.  Diarrhea - BRBPR today but intermittent melena for 1 month.   [facility] by [facility] - last seen last week.   Review of Systems  Positive: Abd pain, diarrhea, BRBPR Negative: Fever   Physical Exam  BP (!) 143/100   Pulse 66   Temp 98.6 F (37 C) (Oral)   Resp 18   Ht 5' 5"  (1.651 m)   Wt 89.8 kg   SpO2 100%   BMI 32.95 kg/m  Gen:   Awake, uncomfortable  Resp:  Normal effort MSK:   Moves extremities without difficulty Other:  Diffuse severe TTP of abdomen. Voluntary guarding. Obese soft abdomen. No CVA.   Medical Decision Making  Medically screening exam initiated at 4:08 PM.  Appropriate orders placed.  Inez WSkillingwas informed that the remainder of the evaluation will be completed by another provider, this initial triage assessment does not replace that evaluation, and the importance of remaining in the ED until their evaluation is complete.  Hx of UC and crohn's on prednisone (likely reason for her elevated WBC -- not new).  Severe TTP of abd - generalized on exam . Will obtain CT w contrast Labs resulted. Stable creat. Neg preg. Lipase WNLs.  UA not collected.    FPati GalloSWhite Knoll PUtah10/11/22 1613    WDaleen Bo MD 11/18/20 1623

## 2020-11-17 NOTE — ED Triage Notes (Signed)
Patient  c/o upper abdominal pain, nausea, diarrhea and constipation x 1 month. Patient was seen on 11/02/20 for the same. Patient states she never had any decrease in symptoms.

## 2021-01-20 ENCOUNTER — Encounter (HOSPITAL_COMMUNITY): Payer: Self-pay | Admitting: Oncology

## 2021-01-20 ENCOUNTER — Other Ambulatory Visit: Payer: Self-pay

## 2021-01-20 ENCOUNTER — Emergency Department (HOSPITAL_COMMUNITY)
Admission: EM | Admit: 2021-01-20 | Discharge: 2021-01-21 | Disposition: A | Discharge status: Home / Self Care | Payer: 59 | Attending: Emergency Medicine | Admitting: Emergency Medicine

## 2021-01-20 DIAGNOSIS — R109 Unspecified abdominal pain: Secondary | ICD-10-CM | POA: Insufficient documentation

## 2021-01-20 DIAGNOSIS — F17210 Nicotine dependence, cigarettes, uncomplicated: Secondary | ICD-10-CM | POA: Insufficient documentation

## 2021-01-20 DIAGNOSIS — K639 Disease of intestine, unspecified: Secondary | ICD-10-CM | POA: Insufficient documentation

## 2021-01-20 DIAGNOSIS — K922 Gastrointestinal hemorrhage, unspecified: Secondary | ICD-10-CM | POA: Diagnosis present

## 2021-01-20 DIAGNOSIS — K529 Noninfective gastroenteritis and colitis, unspecified: Secondary | ICD-10-CM

## 2021-01-20 LAB — CBC WITH DIFFERENTIAL/PLATELET
Abs Immature Granulocytes: 0.09 10*3/uL — ABNORMAL HIGH (ref 0.00–0.07)
Basophils Absolute: 0.1 10*3/uL (ref 0.0–0.1)
Basophils Relative: 1 %
Eosinophils Absolute: 0.4 10*3/uL (ref 0.0–0.5)
Eosinophils Relative: 3 %
HCT: 40.5 % (ref 36.0–46.0)
Hemoglobin: 13.5 g/dL (ref 12.0–15.0)
Immature Granulocytes: 1 %
Lymphocytes Relative: 38 %
Lymphs Abs: 4.9 10*3/uL — ABNORMAL HIGH (ref 0.7–4.0)
MCH: 30.6 pg (ref 26.0–34.0)
MCHC: 33.3 g/dL (ref 30.0–36.0)
MCV: 91.8 fL (ref 80.0–100.0)
Monocytes Absolute: 0.8 10*3/uL (ref 0.1–1.0)
Monocytes Relative: 6 %
Neutro Abs: 6.7 10*3/uL (ref 1.7–7.7)
Neutrophils Relative %: 51 %
Platelets: 342 10*3/uL (ref 150–400)
RBC: 4.41 MIL/uL (ref 3.87–5.11)
RDW: 13 % (ref 11.5–15.5)
WBC: 12.9 10*3/uL — ABNORMAL HIGH (ref 4.0–10.5)
nRBC: 0 % (ref 0.0–0.2)

## 2021-01-20 LAB — URINALYSIS, ROUTINE W REFLEX MICROSCOPIC
Bilirubin Urine: NEGATIVE
Glucose, UA: NEGATIVE mg/dL
Hgb urine dipstick: NEGATIVE
Ketones, ur: NEGATIVE mg/dL
Leukocytes,Ua: NEGATIVE
Nitrite: NEGATIVE
Protein, ur: NEGATIVE mg/dL
Specific Gravity, Urine: >1.03 — ABNORMAL HIGH (ref 1.005–1.030)
pH: 5.5 (ref 5.0–8.0)

## 2021-01-20 LAB — COMPREHENSIVE METABOLIC PANEL
ALT: 20 U/L (ref 0–44)
AST: 18 U/L (ref 15–41)
Albumin: 4.1 g/dL (ref 3.5–5.0)
Alkaline Phosphatase: 83 U/L (ref 38–126)
Anion gap: 5 (ref 5–15)
BUN: 11 mg/dL (ref 6–20)
CO2: 21 mmol/L — ABNORMAL LOW (ref 22–32)
Calcium: 8.6 mg/dL — ABNORMAL LOW (ref 8.9–10.3)
Chloride: 113 mmol/L — ABNORMAL HIGH (ref 98–111)
Creatinine, Ser: 0.74 mg/dL (ref 0.44–1.00)
GFR, Estimated: >60 mL/min (ref >60)
Glucose, Bld: 104 mg/dL — ABNORMAL HIGH (ref 70–99)
Potassium: 3.6 mmol/L (ref 3.5–5.1)
Sodium: 139 mmol/L (ref 135–145)
Total Bilirubin: 0.5 mg/dL (ref 0.3–1.2)
Total Protein: 6.9 g/dL (ref 6.5–8.1)

## 2021-01-20 LAB — I-STAT BETA HCG BLOOD, ED (MC, WL, AP ONLY): I-stat hCG, quantitative: <5 m[IU]/mL (ref <5)

## 2021-01-20 LAB — POC OCCULT BLOOD, ED: Fecal Occult Bld: NEGATIVE

## 2021-01-20 LAB — LIPASE, BLOOD: Lipase: 52 U/L — ABNORMAL HIGH (ref 11–51)

## 2021-01-20 MED ORDER — HYDROMORPHONE HCL 1 MG/ML IJ SOLN
1.0000 mg | Freq: Once | INTRAMUSCULAR | Status: AC
  Administered 2021-01-20: 23:00:00 1 mg via INTRAVENOUS
  Filled 2021-01-20: qty 1

## 2021-01-20 MED ORDER — SODIUM CHLORIDE 0.9 % IV BOLUS (SEPSIS)
1000.0000 mL | Freq: Once | INTRAVENOUS | Status: AC
  Administered 2021-01-20: 23:00:00 1000 mL via INTRAVENOUS

## 2021-01-20 MED ORDER — ONDANSETRON HCL 4 MG/2ML IJ SOLN
4.0000 mg | Freq: Once | INTRAMUSCULAR | Status: AC
  Administered 2021-01-20: 23:00:00 4 mg via INTRAVENOUS
  Filled 2021-01-20: qty 2

## 2021-01-20 MED ORDER — HYDROMORPHONE HCL 1 MG/ML IJ SOLN
1.0000 mg | Freq: Once | INTRAMUSCULAR | Status: AC
  Administered 2021-01-20: 1 mg via INTRAVENOUS
  Filled 2021-01-20: qty 1

## 2021-01-20 MED ORDER — SODIUM CHLORIDE 0.9 % IV SOLN: 1000.0000 mL | INTRAVENOUS | Status: DC

## 2021-01-20 NOTE — ED Provider Notes (Signed)
[place] DEPT Provider Note   CSN: [id number] Arrival date & time: 01/20/21  1556     History Chief Complaint  Patient presents with   GI Bleeding    Sarah Vasquez is a 40 y.o. female.  HPI   Pt states she has history of inflammatory bowel disease.  She had been seeing [facility].  Pt states she was referred to [facility].  Pt states she is allergic to all of the medications they have tried.  She is not currently on any medications.  Pt noticed blood in her stool and abd cramping.   NO fever.  No vomiting.  Past Medical History:  Diagnosis Date   Anxiety    Crohn disease ([place])    Depression    Kidney anomaly, congenital    OCD (obsessive compulsive disorder)    UC (ulcerative colitis) ([place])     There are no problems to display for this patient.   Past Surgical History:  Procedure Laterality Date   COLON SURGERY     FINGER SURGERY     HIP SURGERY       OB History   No obstetric history on file.     Family History  Family history unknown: Yes    Social History   Tobacco Use   Smoking status: Every Day    Packs/day: 1.00    Types: Cigarettes   Smokeless tobacco: Never  Vaping Use   Vaping Use: Never used  Substance Use Topics   Alcohol use: Yes    Comment: occ   Drug use: Not Currently    Home Medications Prior to Admission medications   Medication Sig Start Date End Date Taking? Authorizing Provider  Apremilast (OTEZLA) 30 MG TABS Take 1 tablet (30 mg total) by mouth in the morning and at bedtime. 07/18/19   Carmin Muskrat, MD  diclofenac Sodium (VOLTAREN) 1 % GEL Apply 4 g topically 4 (four) times daily. 07/25/20   Palumbo, April, MD  dicyclomine (BENTYL) 20 MG tablet Take 1 tablet (20 mg total) by mouth 2 (two) times daily. 07/18/19   Carmin Muskrat, MD  dicyclomine (BENTYL) 20 MG tablet Take 1 tablet (20 mg total) by mouth 2 (two) times daily for 20 days. 05/18/20 06/07/20  Wyvonnia Dusky, MD  diphenhydrAMINE  (SOMINEX) 25 MG tablet Take 1 tablet by mouth daily as needed for itching.     [provider]  diphenoxylate-atropine (LOMOTIL) 2.5-0.025 MG tablet Take 1-2 tablets by mouth 4 (four) times daily as needed for diarrhea or loose stools. 12/25/19   Orpah Greek, MD  FLUoxetine (PROZAC) 40 MG capsule Take 1 capsule (40 mg total) by mouth daily. 07/18/19   Carmin Muskrat, MD  fluticasone [facility]) 50 MCG/ACT nasal spray Place 2 sprays into both nostrils daily for 14 days. 10/06/19 10/20/19  Tedd Sias, PA  HYDROcodone-acetaminophen (NORCO/VICODIN) 5-325 MG tablet Take 1 tablet by mouth every 4 (four) hours as needed for moderate pain. 12/25/19   Orpah Greek, MD  lidocaine (LIDODERM) 5 % Place 1 patch onto the skin daily. Remove & Discard patch within 12 hours or as directed by MD 07/25/20   Randal Buba, April, MD  methocarbamol (ROBAXIN) 500 MG tablet Take 1 tablet (500 mg total) by mouth 2 (two) times daily. 07/25/20   Palumbo, April, MD  metoCLOPramide (REGLAN) 10 MG tablet Take 1 tablet (10 mg total) by mouth every 6 (six) hours. 11/17/20   Tedd Sias, PA  ondansetron (ZOFRAN ODT)  4 MG disintegrating tablet Take 1 tablet (4 mg total) by mouth every 8 (eight) hours as needed for nausea or vomiting. 03/11/20   Nuala Alpha A, PA-C  ondansetron (ZOFRAN) 4 MG tablet Take 1 tablet (4 mg total) by mouth every 8 (eight) hours as needed for up to 15 doses for nausea or vomiting. 05/18/20   Wyvonnia Dusky, MD  oxyCODONE-acetaminophen (PERCOCET/ROXICET) 5-325 MG tablet Take 1 tablet by mouth every 8 (eight) hours as needed for severe pain. 11/02/20   Rayna Sexton, PA-C  potassium chloride (KLOR-CON) 10 MEQ tablet Take 1 tablet (10 mEq total) by mouth daily for 30 doses. 05/18/20 06/17/20  Wyvonnia Dusky, MD    Allergies    Morphine and related, Penicillin g, Adalimumab, Azathioprine, and Infliximab  Review of Systems   Review of Systems  All other systems reviewed  and are negative.  Physical Exam Updated Vital Signs BP (!) 148/105    Pulse 77    Temp 98.4 F (36.9 C) (Oral)    Resp 17    Ht 1.651 m (5' 5" )    SpO2 98%    BMI 32.95 kg/m   Physical Exam Vitals and nursing note reviewed.  Constitutional:      General: She is not in acute distress.    Appearance: She is well-developed.  HENT:     Head: Normocephalic and atraumatic.     Right Ear: External ear normal.     Left Ear: External ear normal.  Eyes:     General: No scleral icterus.       Right eye: No discharge.        Left eye: No discharge.     Conjunctiva/sclera: Conjunctivae normal.  Neck:     Trachea: No tracheal deviation.  Cardiovascular:     Rate and Rhythm: Normal rate and regular rhythm.  Pulmonary:     Effort: Pulmonary effort is normal. No respiratory distress.     Breath sounds: Normal breath sounds. No stridor. No wheezing or rales.  Abdominal:     General: Bowel sounds are normal. There is no distension.     Palpations: Abdomen is soft.     Tenderness: There is abdominal tenderness. There is no guarding or rebound.  Genitourinary:    Comments: No gross blood noted on rectal exam Musculoskeletal:        General: No tenderness or deformity.     Cervical back: Neck supple.  Skin:    General: Skin is warm and dry.     Findings: No rash.  Neurological:     General: No focal deficit present.     Mental Status: She is alert.     Cranial Nerves: No cranial nerve deficit (no facial droop, extraocular movements intact, no slurred speech).     Sensory: No sensory deficit.     Motor: No abnormal muscle tone or seizure activity.     Coordination: Coordination normal.  Psychiatric:        Mood and Affect: Mood normal.    ED Results / Procedures / Treatments   Labs (all labs ordered are listed, but only abnormal results are displayed) Labs Reviewed  COMPREHENSIVE METABOLIC PANEL - Abnormal; Notable for the following components:      Result Value   Chloride 113 (*)     CO2 21 (*)    Glucose, Bld 104 (*)    Calcium 8.6 (*)    All other components within normal limits  LIPASE, BLOOD - Abnormal; Notable  for the following components:   Lipase 52 (*)    All other components within normal limits  CBC WITH DIFFERENTIAL/PLATELET - Abnormal; Notable for the following components:   WBC 12.9 (*)    Lymphs Abs 4.9 (*)    Abs Immature Granulocytes 0.09 (*)    All other components within normal limits  URINALYSIS, ROUTINE W REFLEX MICROSCOPIC - Abnormal; Notable for the following components:   Specific Gravity, Urine >1.030 (*)    All other components within normal limits  I-STAT BETA HCG BLOOD, ED ([facility], [facility], [facility] ONLY)  POC OCCULT BLOOD, ED    EKG None  Radiology No results found.  Procedures Procedures   Medications Ordered in ED Medications  sodium chloride 0.9 % bolus 1,000 mL (1,000 mLs Intravenous New Bag/Given 01/20/21 2237)    Followed by  0.9 %  sodium chloride infusion (has no administration in time range)  HYDROmorphone (DILAUDID) injection 1 mg (has no administration in time range)  HYDROmorphone (DILAUDID) injection 1 mg (1 mg Intravenous Given 01/20/21 2236)  ondansetron (ZOFRAN) injection 4 mg (4 mg Intravenous Given 01/20/21 2235)    ED Course  I have reviewed the triage vital signs and the nursing notes.  Pertinent labs & imaging results that were available during my care of the patient were reviewed by me and considered in my medical decision making (see chart for details).    MDM Rules/Calculators/A&P                           Patient presented to the ED for evaluation abdominal pain.  Patient has a history of Crohn's disease.  Patient has history of recurrent episodes of abdominal discomfort.  Patient states she started having diffuse cramping that she feels is similar to her prior Crohn's disease bouts.  She then started noticed some blood in the stool today and this brought her to the ED.  Patient had been seeing a doctor at  [facility] gastroenterology but she states she was referred to a gastroenterologist at [facility].  She is waiting for that evaluation.  Patient states she is not currently on any medications because she has had issues with all of her immune modulating medications.  Prior CT scans reviewed.  Her last one was in October.  It showed stable circumferential transmural wall thickening in the ileum similar to prior CT scans.  Patient's labs today showed decreasing white blood cell count.  She does have history of leukocytosis.  Her electrolyte panel was unremarkable.  She is not anemic.  Patient was treated with IV pain medications and IV fluids.  Her symptoms have improved.  Discussed findings and at this time do not feel that repeat CT scan is necessary.  Recommend close follow-up with her GI doctor.  Return to the ED for worsening symptoms fevers chills  Final Clinical Impression(s) / ED Diagnoses Final diagnoses:  Abdominal pain, unspecified abdominal location  Inflammatory bowel disease    Rx / DC Orders ED Discharge Orders     None        Dorie Rank, MD 01/20/21 2333

## 2021-01-20 NOTE — Discharge Instructions (Signed)
Follow up with your gi doctor.  Return as needed for worsening symptoms.

## 2021-01-20 NOTE — ED Triage Notes (Signed)
Pt reports hx of UC states for the last 36 hours she has been passing blood clots from her rectum.  Pt reports being dizzy at work today and having to leave.

## 2021-01-20 NOTE — ED Provider Notes (Signed)
Emergency Medicine Provider Triage Evaluation Note  Sarah Vasquez , a 40 y.o. female  was evaluated in triage.  Pt complains of rectal bleeding / clots, abdominal pain, nausea for the last few days. Hx of crohn's. Was on steroids in October, hasn't contacted GI team as she is currently transitioning to new care team. Rates abdominal pain 7-8/10. Zofran keeping nausea under control.  Review of Systems  Positive: Rectal bleeding, abdominal pain, dizziness Negative: Chest pain, leg swelling  Physical Exam  BP (!) 144/105 (BP Location: Left Arm)    Pulse 78    Temp 98.4 F (36.9 C) (Oral)    Resp 16    Ht 5' 5"  (1.651 m)    SpO2 100%    BMI 32.95 kg/m  Gen:   Awake, no distress   Resp:  Normal effort  MSK:   Moves extremities without difficulty  Other:  Ttp diffusely throughout abdomen  Medical Decision Making  Medically screening exam initiated at 4:50 PM.  Appropriate orders placed.  Sarah Vasquez was informed that the remainder of the evaluation will be completed by another provider, this initial triage assessment does not replace that evaluation, and the importance of remaining in the ED until their evaluation is complete.  Crohn's flare   Sarah Vasquez 01/20/21 1652    Sarah Saver, MD 01/20/21 [phone]

## 2021-05-03 ENCOUNTER — Emergency Department (HOSPITAL_COMMUNITY)
Admission: EM | Admit: 2021-05-03 | Discharge: 2021-05-04 | Disposition: A | Discharge status: Home / Self Care | Payer: Medicaid - Out of State | Attending: Emergency Medicine | Admitting: Emergency Medicine

## 2021-05-03 ENCOUNTER — Encounter (HOSPITAL_COMMUNITY): Payer: Self-pay

## 2021-05-03 ENCOUNTER — Other Ambulatory Visit: Payer: Self-pay

## 2021-05-03 DIAGNOSIS — D72829 Elevated white blood cell count, unspecified: Secondary | ICD-10-CM | POA: Insufficient documentation

## 2021-05-03 DIAGNOSIS — R1084 Generalized abdominal pain: Secondary | ICD-10-CM | POA: Insufficient documentation

## 2021-05-03 DIAGNOSIS — R109 Unspecified abdominal pain: Secondary | ICD-10-CM | POA: Diagnosis present

## 2021-05-03 LAB — CBC WITH DIFFERENTIAL/PLATELET
Abs Immature Granulocytes: 0.06 10*3/uL (ref 0.00–0.07)
Basophils Absolute: 0.1 10*3/uL (ref 0.0–0.1)
Basophils Relative: 1 %
Eosinophils Absolute: 0.3 10*3/uL (ref 0.0–0.5)
Eosinophils Relative: 2 %
HCT: 41.9 % (ref 36.0–46.0)
Hemoglobin: 14.5 g/dL (ref 12.0–15.0)
Immature Granulocytes: 0 %
Lymphocytes Relative: 37 %
Lymphs Abs: 6 10*3/uL — ABNORMAL HIGH (ref 0.7–4.0)
MCH: 30.9 pg (ref 26.0–34.0)
MCHC: 34.6 g/dL (ref 30.0–36.0)
MCV: 89.1 fL (ref 80.0–100.0)
Monocytes Absolute: 0.7 10*3/uL (ref 0.1–1.0)
Monocytes Relative: 5 %
Neutro Abs: 8.8 10*3/uL — ABNORMAL HIGH (ref 1.7–7.7)
Neutrophils Relative %: 55 %
Platelets: 319 10*3/uL (ref 150–400)
RBC: 4.7 MIL/uL (ref 3.87–5.11)
RDW: 13.3 % (ref 11.5–15.5)
WBC: 16 10*3/uL — ABNORMAL HIGH (ref 4.0–10.5)
nRBC: 0 % (ref 0.0–0.2)

## 2021-05-03 LAB — COMPREHENSIVE METABOLIC PANEL
ALT: 20 U/L (ref 0–44)
AST: 17 U/L (ref 15–41)
Albumin: 4.1 g/dL (ref 3.5–5.0)
Alkaline Phosphatase: 80 U/L (ref 38–126)
Anion gap: 11 (ref 5–15)
BUN: 9 mg/dL (ref 6–20)
CO2: 23 mmol/L (ref 22–32)
Calcium: 8.7 mg/dL — ABNORMAL LOW (ref 8.9–10.3)
Chloride: 105 mmol/L (ref 98–111)
Creatinine, Ser: 0.86 mg/dL (ref 0.44–1.00)
GFR, Estimated: >60 mL/min (ref >60)
Glucose, Bld: 106 mg/dL — ABNORMAL HIGH (ref 70–99)
Potassium: 3.3 mmol/L — ABNORMAL LOW (ref 3.5–5.1)
Sodium: 139 mmol/L (ref 135–145)
Total Bilirubin: 0.3 mg/dL (ref 0.3–1.2)
Total Protein: 7.5 g/dL (ref 6.5–8.1)

## 2021-05-03 LAB — LIPASE, BLOOD: Lipase: 52 U/L — ABNORMAL HIGH (ref 11–51)

## 2021-05-03 MED ORDER — OXYCODONE-ACETAMINOPHEN 5-325 MG PO TABS
1.0000 | ORAL_TABLET | Freq: Once | ORAL | Status: AC
  Administered 2021-05-03: 1 via ORAL
  Filled 2021-05-03: qty 1

## 2021-05-03 NOTE — ED Provider Triage Note (Signed)
Emergency Medicine Provider Triage Evaluation Note ? ?Sarah Vasquez , a 41 y.o. female  was evaluated in triage.  Pt complains of abdominal pain. The patient has extensive history of Crohn's disease and is meeting with a surgeon tomorrow to set a surgery date. She has previously had appendectomy, cholecystectomy, and ileostomy. Patient is here tonight due to increase in pain requesting IVF and pain medication. Patient denies nausea, vomiting, diarrhea. Patient denies chest pain or shortness of breath ? ?Review of Systems  ?Positive: Abdominal pain ?Negative: Nausea, vomiting ? ?Physical Exam  ?BP (!) 146/97 (BP Location: Left Arm)   Pulse 83   Temp 99 ?F (37.2 ?C) (Oral)   Resp 16   Ht 5' 5"  (1.651 m)   Wt 95.3 kg   SpO2 100%   BMI 34.95 kg/m?  ?Gen:   Awake, no distress   ?Resp:  Normal effort  ?MSK:   Moves extremities without difficulty  ?Other:   ? ?Medical Decision Making  ?Medically screening exam initiated at 10:40 PM.  Appropriate orders placed.  Sarah Vasquez was informed that the remainder of the evaluation will be completed by another provider, this initial triage assessment does not replace that evaluation, and the importance of remaining in the ED until their evaluation is complete. ? ? ?  ?Dorothyann Peng, PA-C ?05/03/21 2245 ? ?

## 2021-05-03 NOTE — ED Triage Notes (Signed)
Patient presents to ED with c/o abdominal pain and diarrhea. States she has chron's disease and her appt with the surgeon is tomorrow, states she was unable to control the pain and decided to come to ED.  ?

## 2021-05-04 MED ORDER — HYDROMORPHONE HCL 1 MG/ML IJ SOLN
1.0000 mg | Freq: Once | INTRAMUSCULAR | Status: AC
  Administered 2021-05-04: 1 mg via INTRAVENOUS
  Filled 2021-05-04: qty 1

## 2021-05-04 MED ORDER — ONDANSETRON HCL 4 MG/2ML IJ SOLN
4.0000 mg | Freq: Once | INTRAMUSCULAR | Status: AC
  Administered 2021-05-04: 4 mg via INTRAVENOUS
  Filled 2021-05-04: qty 2

## 2021-05-04 MED ORDER — MORPHINE SULFATE 30 MG PO TABS
15.0000 mg | ORAL_TABLET | ORAL | 0 refills | Status: AC | PRN
Start: 2021-05-04 — End: ?

## 2021-05-04 MED ORDER — LACTATED RINGERS IV BOLUS
1000.0000 mL | Freq: Once | INTRAVENOUS | Status: AC
  Administered 2021-05-04: 1000 mL via INTRAVENOUS

## 2021-05-04 NOTE — ED Notes (Signed)
Discharge instructions reviewed, questions answered. Rx education provided. Pt states understanding and no further questions. Pt ambulatory with steady gait upon discharge. No s/s of distress noted. ? ?

## 2021-05-04 NOTE — ED Provider Notes (Signed)
?[facility] DEPT ?Provider Note ? ? ?CSN: [id number] ?Arrival date & time: 05/03/21  2219 ? ?  ? ?History ? ?Chief Complaint  ?Patient presents with  ? Abdominal Pain  ? ? ?Sarah Vasquez is a 41 y.o. female. ? ?41 year old female who presents the ER with abdominal pain.  Patient is a long history of Crohn's status post multiple surgeries including appendectomy, cholecystectomy and ileectomy.  Recently started having worsening symptoms again was seen by gastroenterology with [facility] and the ED there as well in the last few months.  CT scan showed some new strictures which were not able to be dilated by GI.  Also found to have another area of Crohn's flare near the anastomosis in the ileum.  She is scheduled to see a surgeon tomorrow to discuss a surgical plan.  States that her pain is similar to what its been just slightly worse as she is not able to take any Biologics or anti-inflammatories secondary to other autoimmune diseases.  No nausea or vomiting.  Some blood intermittently in her stools but no persistent diarrhea.  No fevers.  No distention. ? ? ?Abdominal Pain ? ?  ? ?Home Medications ?Prior to Admission medications   ?Medication Sig Start Date End Date Taking? Authorizing Provider  ?morphine (MSIR) 30 MG tablet Take 0.5 tablets (15 mg total) by mouth every 4 (four) hours as needed for severe pain. 05/04/21  Yes Tevis Dunavan, Corene Cornea, MD  ?Apremilast (OTEZLA) 30 MG TABS Take 1 tablet (30 mg total) by mouth in the morning and at bedtime. 07/18/19   Carmin Muskrat, MD  ?diclofenac Sodium (VOLTAREN) 1 % GEL Apply 4 g topically 4 (four) times daily. 07/25/20   Palumbo, April, MD  ?dicyclomine (BENTYL) 20 MG tablet Take 1 tablet (20 mg total) by mouth 2 (two) times daily. 07/18/19   Carmin Muskrat, MD  ?dicyclomine (BENTYL) 20 MG tablet Take 1 tablet (20 mg total) by mouth 2 (two) times daily for 20 days. 05/18/20 06/07/20  Wyvonnia Dusky, MD  ?diphenhydrAMINE (SOMINEX) 25 MG tablet  Take 1 tablet by mouth daily as needed for itching.     [provider]  ?diphenoxylate-atropine (LOMOTIL) 2.5-0.025 MG tablet Take 1-2 tablets by mouth 4 (four) times daily as needed for diarrhea or loose stools. 12/25/19   Orpah Greek, MD  ?FLUoxetine (PROZAC) 40 MG capsule Take 1 capsule (40 mg total) by mouth daily. 07/18/19   Carmin Muskrat, MD  ?fluticasone [facility]) 50 MCG/ACT nasal spray Place 2 sprays into both nostrils daily for 14 days. 10/06/19 10/20/19  Tedd Sias, PA  ?HYDROcodone-acetaminophen (NORCO/VICODIN) 5-325 MG tablet Take 1 tablet by mouth every 4 (four) hours as needed for moderate pain. 12/25/19   Orpah Greek, MD  ?lidocaine (LIDODERM) 5 % Place 1 patch onto the skin daily. Remove & Discard patch within 12 hours or as directed by MD 07/25/20   Randal Buba, April, MD  ?methocarbamol (ROBAXIN) 500 MG tablet Take 1 tablet (500 mg total) by mouth 2 (two) times daily. 07/25/20   Palumbo, April, MD  ?metoCLOPramide (REGLAN) 10 MG tablet Take 1 tablet (10 mg total) by mouth every 6 (six) hours. 11/17/20   Tedd Sias, PA  ?ondansetron (ZOFRAN ODT) 4 MG disintegrating tablet Take 1 tablet (4 mg total) by mouth every 8 (eight) hours as needed for nausea or vomiting. 03/11/20   Nuala Alpha A, PA-C  ?ondansetron (ZOFRAN) 4 MG tablet Take 1 tablet (4 mg total) by mouth every 8 (  eight) hours as needed for up to 15 doses for nausea or vomiting. 05/18/20   Wyvonnia Dusky, MD  ?oxyCODONE-acetaminophen (PERCOCET/ROXICET) 5-325 MG tablet Take 1 tablet by mouth every 8 (eight) hours as needed for severe pain. 11/02/20   Rayna Sexton, PA-C  ?potassium chloride (KLOR-CON) 10 MEQ tablet Take 1 tablet (10 mEq total) by mouth daily for 30 doses. 05/18/20 06/17/20  Wyvonnia Dusky, MD  ?   ? ?Allergies    ?Morphine and related, Penicillin g, Adalimumab, Azathioprine, and Infliximab   ? ?Review of Systems   ?Review of Systems  ?Gastrointestinal:  Positive for abdominal pain.   ? ?Physical Exam ?Updated Vital Signs ?BP (!) 141/86   Pulse 84   Temp 99 ?F (37.2 ?C) (Oral)   Resp 16   Ht 5' 5"  (1.651 m)   Wt 95.3 kg   SpO2 97%   BMI 34.95 kg/m?  ?Physical Exam ?Vitals and nursing note reviewed.  ?Constitutional:   ?   Appearance: She is well-developed.  ?HENT:  ?   Head: Normocephalic and atraumatic.  ?   Mouth/Throat:  ?   Mouth: Mucous membranes are moist.  ?   Pharynx: Oropharynx is clear.  ?Eyes:  ?   Pupils: Pupils are equal, round, and reactive to light.  ?Cardiovascular:  ?   Rate and Rhythm: Normal rate and regular rhythm.  ?Pulmonary:  ?   Effort: No respiratory distress.  ?   Breath sounds: No stridor.  ?Abdominal:  ?   General: Abdomen is flat. There is no distension.  ?   Tenderness: There is no abdominal tenderness. There is no guarding or rebound.  ?Musculoskeletal:  ?   Cervical back: Normal range of motion.  ?Skin: ?   General: Skin is warm and dry.  ?Neurological:  ?   General: No focal deficit present.  ?   Mental Status: She is alert and oriented to person, place, and time.  ? ? ?ED Results / Procedures / Treatments   ?Labs ?(all labs ordered are listed, but only abnormal results are displayed) ?Labs Reviewed  ?COMPREHENSIVE METABOLIC PANEL - Abnormal; Notable for the following components:  ?    Result Value  ? Potassium 3.3 (*)   ? Glucose, Bld 106 (*)   ? Calcium 8.7 (*)   ? All other components within normal limits  ?LIPASE, BLOOD - Abnormal; Notable for the following components:  ? Lipase 52 (*)   ? All other components within normal limits  ?CBC WITH DIFFERENTIAL/PLATELET - Abnormal; Notable for the following components:  ? WBC 16.0 (*)   ? Neutro Abs 8.8 (*)   ? Lymphs Abs 6.0 (*)   ? All other components within normal limits  ? ? ?EKG ?None ? ?Radiology ?No results found. ? ?Procedures ?Procedures  ? ? ?Medications Ordered in ED ?Medications  ?oxyCODONE-acetaminophen (PERCOCET/ROXICET) 5-325 MG per tablet 1 tablet (1 tablet Oral Given 05/03/21 2313)   ?HYDROmorphone (DILAUDID) injection 1 mg (1 mg Intravenous Given 05/04/21 0101)  ?lactated ringers bolus 1,000 mL (0 mLs Intravenous Stopped 05/04/21 0347)  ?ondansetron (ZOFRAN) injection 4 mg (4 mg Intravenous Given 05/04/21 0100)  ?HYDROmorphone (DILAUDID) injection 1 mg (1 mg Intravenous Given 05/04/21 0252)  ? ? ?ED Course/ Medical Decision Making/ A&P ?  ?                        ?Medical Decision Making ?Risk ?Prescription drug management. ? ? ?Seems like its consistent  with her previous flare that she received a CT scan last month.  I discussed with the patient with relatively unremarkable labs aside from elevated white blood cell count which is expected with Crohn's disease we do not see any indication for repeat imaging at this time.  We will go ahead and treat her with some pain medicine as she has had demonstrable abnormalities recently that would cause significant pain.  Encouraged her to follow-up with the pain clinic to she can get on some long-acting pain treatments since she could not get the normal treatments he would get for Crohn's.  No evidence of severe dehydration.  Will likely be stable for discharge after medications. Feels better. Meds prescribed. Pain management phone number provided.  ? ? ?Final Clinical Impression(s) / ED Diagnoses ?Final diagnoses:  ?Generalized abdominal pain  ? ? ?Rx / DC Orders ?ED Discharge Orders   ? ?      Ordered  ?  morphine (MSIR) 30 MG tablet  Every 4 hours PRN       ? 05/04/21 0247  ? ?  ?  ? ?  ? ? ?  ?Merrily Pew, MD ?05/04/21 3545 ? ?

## 2021-10-13 ENCOUNTER — Encounter (HOSPITAL_BASED_OUTPATIENT_CLINIC_OR_DEPARTMENT_OTHER): Payer: Self-pay

## 2021-10-13 ENCOUNTER — Emergency Department (HOSPITAL_BASED_OUTPATIENT_CLINIC_OR_DEPARTMENT_OTHER)
Admission: EM | Admit: 2021-10-13 | Discharge: 2021-10-13 | Disposition: A | Discharge status: Home / Self Care | Payer: Medicaid - Out of State | Attending: Emergency Medicine | Admitting: Emergency Medicine

## 2021-10-13 ENCOUNTER — Other Ambulatory Visit: Payer: Self-pay

## 2021-10-13 DIAGNOSIS — R55 Syncope and collapse: Secondary | ICD-10-CM | POA: Insufficient documentation

## 2021-10-13 DIAGNOSIS — D72829 Elevated white blood cell count, unspecified: Secondary | ICD-10-CM | POA: Insufficient documentation

## 2021-10-13 DIAGNOSIS — I10 Essential (primary) hypertension: Secondary | ICD-10-CM | POA: Diagnosis not present

## 2021-10-13 DIAGNOSIS — Z79899 Other long term (current) drug therapy: Secondary | ICD-10-CM | POA: Diagnosis not present

## 2021-10-13 HISTORY — DX: Essential (primary) hypertension: I10

## 2021-10-13 LAB — BASIC METABOLIC PANEL
Anion gap: 7 (ref 5–15)
BUN: 8 mg/dL (ref 6–20)
CO2: 25 mmol/L (ref 22–32)
Calcium: 9.2 mg/dL (ref 8.9–10.3)
Chloride: 107 mmol/L (ref 98–111)
Creatinine, Ser: 0.71 mg/dL (ref 0.44–1.00)
GFR, Estimated: >60 mL/min (ref >60)
Glucose, Bld: 113 mg/dL — ABNORMAL HIGH (ref 70–99)
Potassium: 3.5 mmol/L (ref 3.5–5.1)
Sodium: 139 mmol/L (ref 135–145)

## 2021-10-13 LAB — CBC
HCT: 40 % (ref 36.0–46.0)
Hemoglobin: 13.9 g/dL (ref 12.0–15.0)
MCH: 30.3 pg (ref 26.0–34.0)
MCHC: 34.8 g/dL (ref 30.0–36.0)
MCV: 87.3 fL (ref 80.0–100.0)
Platelets: 324 10*3/uL (ref 150–400)
RBC: 4.58 MIL/uL (ref 3.87–5.11)
RDW: 12.9 % (ref 11.5–15.5)
WBC: 11.9 10*3/uL — ABNORMAL HIGH (ref 4.0–10.5)
nRBC: 0 % (ref 0.0–0.2)

## 2021-10-13 LAB — CBG MONITORING, ED: Glucose-Capillary: 117 mg/dL — ABNORMAL HIGH (ref 70–99)

## 2021-10-13 MED ORDER — SODIUM CHLORIDE 0.9 % IV BOLUS
1000.0000 mL | Freq: Once | INTRAVENOUS | Status: AC
  Administered 2021-10-13: 1000 mL via INTRAVENOUS

## 2021-10-13 MED ORDER — DICYCLOMINE HCL 10 MG PO CAPS
10.0000 mg | ORAL_CAPSULE | Freq: Once | ORAL | Status: AC
  Administered 2021-10-13: 10 mg via ORAL
  Filled 2021-10-13: qty 1

## 2021-10-13 MED ORDER — ONDANSETRON HCL 4 MG/2ML IJ SOLN
4.0000 mg | Freq: Once | INTRAMUSCULAR | Status: AC
Start: 2021-10-13 — End: 2021-10-13
  Administered 2021-10-13: 4 mg via INTRAVENOUS
  Filled 2021-10-13: qty 2

## 2021-10-13 NOTE — ED Notes (Signed)
Patient states that she still cannt void

## 2021-10-13 NOTE — ED Triage Notes (Signed)
Pt reports she passed out yesterday after vomiting x1 yesterday. Pt reports she still feels weak and dizzy. No further vomiting just loose stools, but has chrons disease

## 2021-10-13 NOTE — ED Notes (Signed)
Patient reports that she is currently light headed alert x4

## 2021-10-13 NOTE — ED Provider Notes (Signed)
[facility] EMERGENCY DEPARTMENT Provider Note   CSN: [id number] Arrival date & time: 10/13/21  1217     History  Chief Complaint  Patient presents with   Loss of Consciousness    Sarah Vasquez is a 41 y.o. female with medical history of anxiety, Crohn's disease, depression, hypertension, OCD.  Patient presents to ED for evaluation of syncopal event, vomiting.  Patient states that for the last 1 week she has had increased nausea and vomiting which is typical of her Crohn's disease.  The patient states that yesterday she had 1 episode of nonbloody nonbilious emesis at work which caused her to pass out, lose consciousness.  The patient states she is unsure of how long she was passed out for, only remembers waking up and texting her coworker to come and check on her.  The patient states that since this time she has felt more "heavy", more lethargic.  The patient states that she is not currently taking any immune modulating medications for Crohn's disease because she is "allergic to all of them".  The patient states that she is currently being seen by [facility] gastroenterology for her Crohn's disease.  Patient states that she has been able to control her nausea at home utilizing her Zofran that is prescribed to her however when she lost consciousness yesterday this alarmed her causing her to present to ED for evaluation.  Patient is endorsing abdominal cramping which is typical for her Crohn's flares, diarrhea, nausea, vomiting, loss of consciousness.  The patient denies any fevers, one-sided weakness or numbness, dysuria, blood in stool.   Loss of Consciousness Associated symptoms: nausea and vomiting   Associated symptoms: no fever and no weakness        Home Medications Prior to Admission medications   Medication Sig Start Date End Date Taking? Authorizing Provider  Apremilast (OTEZLA) 30 MG TABS Take 1 tablet (30 mg total) by mouth in the morning and at bedtime. 07/18/19    Carmin Muskrat, MD  diclofenac Sodium (VOLTAREN) 1 % GEL Apply 4 g topically 4 (four) times daily. 07/25/20   Palumbo, April, MD  dicyclomine (BENTYL) 20 MG tablet Take 1 tablet (20 mg total) by mouth 2 (two) times daily. 07/18/19   Carmin Muskrat, MD  dicyclomine (BENTYL) 20 MG tablet Take 1 tablet (20 mg total) by mouth 2 (two) times daily for 20 days. 05/18/20 06/07/20  Wyvonnia Dusky, MD  diphenhydrAMINE (SOMINEX) 25 MG tablet Take 1 tablet by mouth daily as needed for itching.     [provider]  diphenoxylate-atropine (LOMOTIL) 2.5-0.025 MG tablet Take 1-2 tablets by mouth 4 (four) times daily as needed for diarrhea or loose stools. 12/25/19   Orpah Greek, MD  FLUoxetine (PROZAC) 40 MG capsule Take 1 capsule (40 mg total) by mouth daily. 07/18/19   Carmin Muskrat, MD  fluticasone [facility]) 50 MCG/ACT nasal spray Place 2 sprays into both nostrils daily for 14 days. 10/06/19 10/20/19  Tedd Sias, PA  HYDROcodone-acetaminophen (NORCO/VICODIN) 5-325 MG tablet Take 1 tablet by mouth every 4 (four) hours as needed for moderate pain. 12/25/19   Orpah Greek, MD  lidocaine (LIDODERM) 5 % Place 1 patch onto the skin daily. Remove & Discard patch within 12 hours or as directed by MD 07/25/20   Randal Buba, April, MD  methocarbamol (ROBAXIN) 500 MG tablet Take 1 tablet (500 mg total) by mouth 2 (two) times daily. 07/25/20   Palumbo, April, MD  metoCLOPramide (REGLAN) 10 MG tablet Take  1 tablet (10 mg total) by mouth every 6 (six) hours. 11/17/20   Tedd Sias, PA  morphine (MSIR) 30 MG tablet Take 0.5 tablets (15 mg total) by mouth every 4 (four) hours as needed for severe pain. 05/04/21   Mesner, Corene Cornea, MD  ondansetron (ZOFRAN ODT) 4 MG disintegrating tablet Take 1 tablet (4 mg total) by mouth every 8 (eight) hours as needed for nausea or vomiting. 03/11/20   Nuala Alpha A, PA-C  ondansetron (ZOFRAN) 4 MG tablet Take 1 tablet (4 mg total) by mouth every 8 (eight)  hours as needed for up to 15 doses for nausea or vomiting. 05/18/20   Wyvonnia Dusky, MD  oxyCODONE-acetaminophen (PERCOCET/ROXICET) 5-325 MG tablet Take 1 tablet by mouth every 8 (eight) hours as needed for severe pain. 11/02/20   Rayna Sexton, PA-C  potassium chloride (KLOR-CON) 10 MEQ tablet Take 1 tablet (10 mEq total) by mouth daily for 30 doses. 05/18/20 06/17/20  Wyvonnia Dusky, MD      Allergies    Morphine and related, Penicillin g, Adalimumab, Azathioprine, and Infliximab    Review of Systems   Review of Systems  Constitutional:  Negative for fever.  Cardiovascular:  Positive for syncope.  Gastrointestinal:  Positive for abdominal pain, diarrhea, nausea and vomiting. Negative for blood in stool.  Genitourinary:  Negative for dysuria.  Neurological:  Positive for syncope and light-headedness. Negative for weakness and numbness.  All other systems reviewed and are negative.   Physical Exam Updated Vital Signs BP 130/89   Pulse 62   Temp 98.3 F (36.8 C) (Oral)   Resp 14   Ht 5' 5"  (1.651 m)   Wt 87.5 kg   SpO2 100%   BMI 32.12 kg/m  Physical Exam Vitals and nursing note reviewed.  Constitutional:      General: She is not in acute distress.    Appearance: Normal appearance. She is not ill-appearing, toxic-appearing or diaphoretic.     Comments: Patient sitting comfortably in bed on her phone.  HENT:     Head: Normocephalic and atraumatic.     Nose: Nose normal. No congestion.     Mouth/Throat:     Mouth: Mucous membranes are moist.     Pharynx: Oropharynx is clear.  Eyes:     Extraocular Movements: Extraocular movements intact.     Conjunctiva/sclera: Conjunctivae normal.     Pupils: Pupils are equal, round, and reactive to light.  Cardiovascular:     Rate and Rhythm: Normal rate and regular rhythm.  Pulmonary:     Effort: Pulmonary effort is normal.     Breath sounds: Normal breath sounds. No wheezing.  Abdominal:     General: Abdomen is flat. Bowel  sounds are normal.     Palpations: Abdomen is soft.     Tenderness: There is no abdominal tenderness. There is no right CVA tenderness or left CVA tenderness.     Comments: Patient abdomen soft and compressible throughout.  There is no tenderness, rebound, guarding.  There is no overlying skin change.  Musculoskeletal:     Cervical back: Normal range of motion and neck supple. No tenderness.  Skin:    General: Skin is warm and dry.     Capillary Refill: Capillary refill takes less than 2 seconds.  Neurological:     Mental Status: She is alert and oriented to person, place, and time.     GCS: GCS eye subscore is 4. GCS verbal subscore is 5. GCS motor subscore  is 6.     Cranial Nerves: Cranial nerves 2-12 are intact. No cranial nerve deficit.     Sensory: Sensation is intact. No sensory deficit.     Motor: Motor function is intact. No weakness.     Coordination: Coordination is intact. Heel to [facility] Test normal.     Comments: Patient follows commands appropriately.  Patient has intact heel-to-shin.  Patient with equal grip strength to upper and lower extremities bilaterally.     ED Results / Procedures / Treatments   Labs (all labs ordered are listed, but only abnormal results are displayed) Labs Reviewed  BASIC METABOLIC PANEL - Abnormal; Notable for the following components:      Result Value   Glucose, Bld 113 (*)    All other components within normal limits  CBC - Abnormal; Notable for the following components:   WBC 11.9 (*)    All other components within normal limits  CBG MONITORING, ED - Abnormal; Notable for the following components:   Glucose-Capillary 117 (*)    All other components within normal limits  URINALYSIS, ROUTINE W REFLEX MICROSCOPIC  PREGNANCY, URINE    EKG EKG Interpretation  Date/Time:  Wednesday October 13 2021 12:36:53 EDT Ventricular Rate:  71 PR Interval:  138 QRS Duration: 80 QT Interval:  392 QTC Calculation: 425 R Axis:   -57 Text  Interpretation: Normal sinus rhythm Low voltage QRS Left anterior fascicular block Cannot rule out Inferior infarct (masked by fascicular block?) , age undetermined Possible Anterolateral infarct , age undetermined Abnormal ECG When compared with ECG of 18-May-2020 20:32, PREVIOUS ECG IS PRESENT No significant change since last tracing Confirmed by Isla Pence [phone]) on 10/13/2021 2:54:12 PM  Radiology No results found.  Procedures Procedures   Medications Ordered in ED Medications  sodium chloride 0.9 % bolus 1,000 mL (1,000 mLs Intravenous New Bag/Given 10/13/21 1433)  ondansetron (ZOFRAN) injection 4 mg (4 mg Intravenous Given 10/13/21 1434)  dicyclomine (BENTYL) capsule 10 mg (10 mg Oral Given 10/13/21 1427)    ED Course/ Medical Decision Making/ A&P Clinical Course as of 10/13/21 1643  Wed Oct 13, 2021  1628 Stable 40 YOF with abdominal pain here with syncopal episode. HX of Crohn's disease. Not on IS. Symptomatically resolved [CC]    Clinical Course User Index [CC] Tretha Sciara, MD                           Medical Decision Making Amount and/or Complexity of Data Reviewed Labs: ordered.   41 year old female presents to ED for evaluation.  Please see HPI for further details.  On examination the patient is afebrile and nontachycardic.  The patient lung sounds are clear bilaterally, she is not hypoxic.  The patient abdomen is soft throughout, no rebound guarding or tenderness.  The patient neurological examination shows no focal neurodeficits.  The patient has equal grip strength bilateral upper and lower extremities.  The patient follows commands appropriately.  The patient is nontoxic in appearance.  Patient worked up utilizing the following labs and imaging studies interpreted by me personally: - CBC with slight leukocytosis to 11.9 over the patient is afebrile and nontachycardic - BMP unremarkable - CBG 117 - Urinalysis was not collected, the patient states she is unable  to urinate - EKG nonischemic  Patient discussed with attending Dr. Oswald Hillock who agrees with plan of management.  At this time, the patient is reporting she feels better after medications and fluids are given.  The patient we discharged home and advised to follow-up with her PCP for further management.  Patient was encouraged to return to the ED with any new or worsening signs or symptoms and she voiced understanding of my instructions.  The patient had all of her questions answered to her satisfaction prior to discharge.  Patient stable for discharge home.  Final Clinical Impression(s) / ED Diagnoses Final diagnoses:  Syncope and collapse    Rx / DC Orders ED Discharge Orders     None         Azucena Cecil, PA-C 10/13/21 1643    Isla Pence, MD 10/16/21 1512

## 2021-10-13 NOTE — ED Notes (Signed)
Unable to provide specimen at this time

## 2021-10-13 NOTE — Discharge Instructions (Addendum)
Return to the ED with any new or worsening signs or symptoms Please follow-up with your PCP for further management Please continue hydrating yourself at home Please read attached guide concerning syncope in adults

## 2022-02-03 IMAGING — CT CT ABD-PELV W/O CM
2 of 4 series · 16 of 46 positions shown, 18 images · non-contrast
Comparison: None.

CLINICAL DATA: Crohn's flare up x5 days.  Abdominal pain.

EXAM:
CT ABDOMEN AND PELVIS WITHOUT CONTRAST
TECHNIQUE: Multidetector CT imaging of the abdomen and pelvis was performed
following the standard protocol without IV contrast.

[Series 3: a/p w/o 5mm · axial · non-contrast · 0.98mm/px · z∈[+1120,+1610]mm · 13 of 108 slices shown, 15 images]
[im 5/108  soft-tissue]
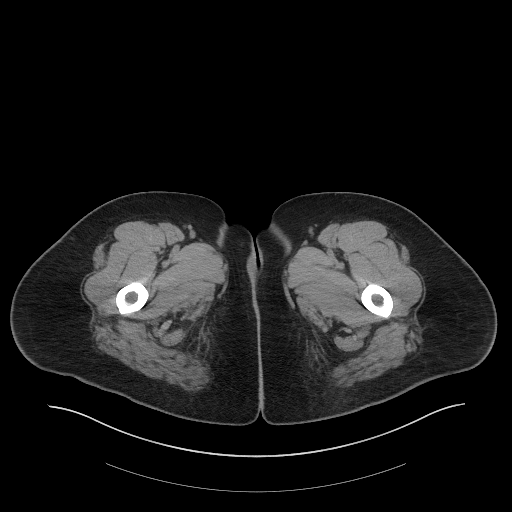
[im 5/108  bone]
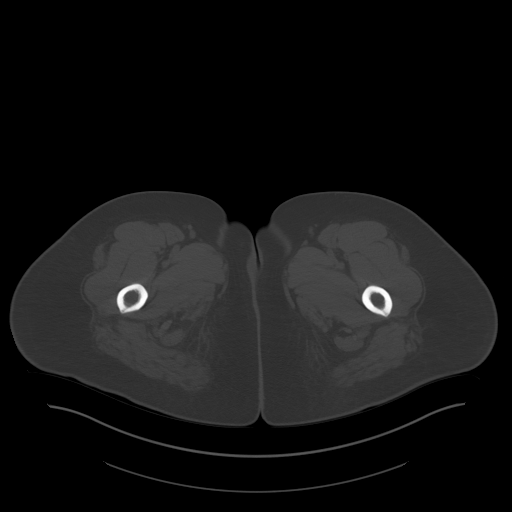
[im 14/108  soft-tissue]
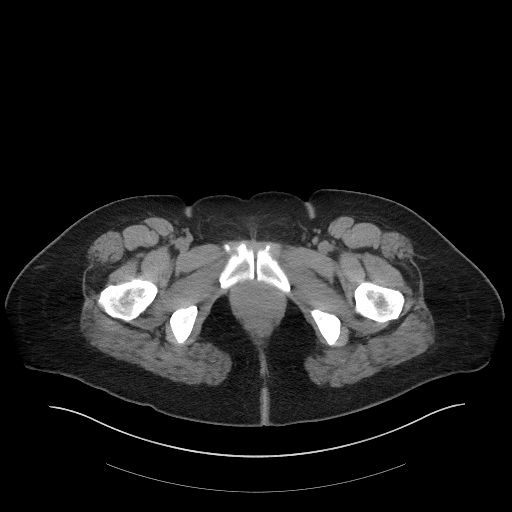
[im 24/108  soft-tissue]
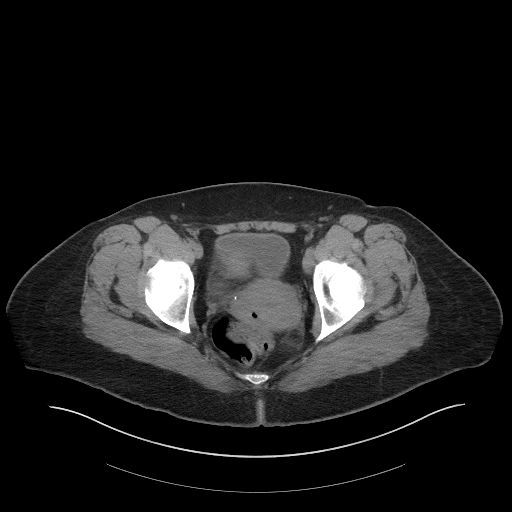
[im 28/108  soft-tissue]
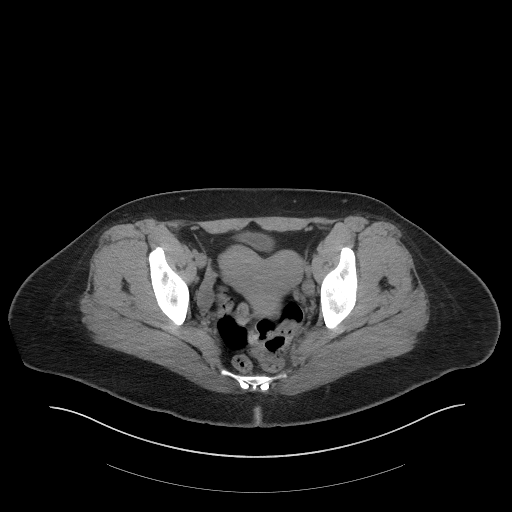
[im 38/108  soft-tissue]
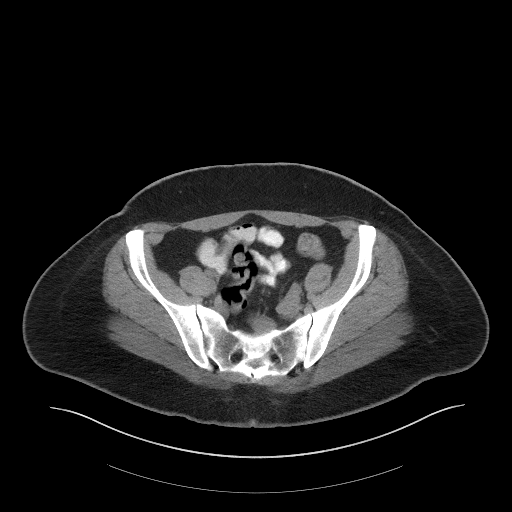
[im 47/108  soft-tissue]
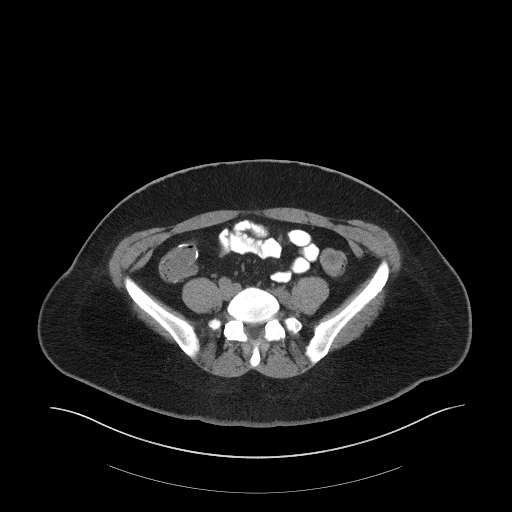
[im 56/108  soft-tissue]
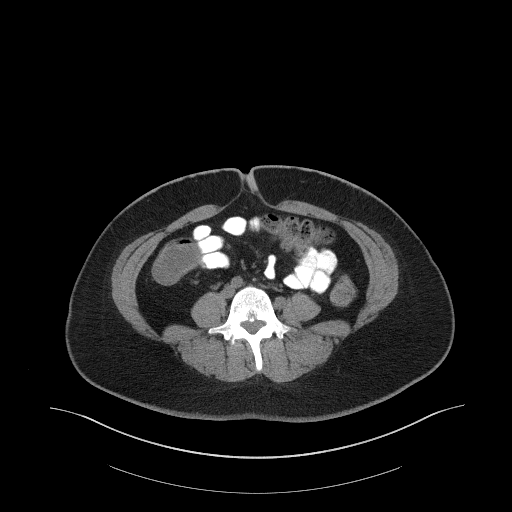
[im 61/108  soft-tissue]
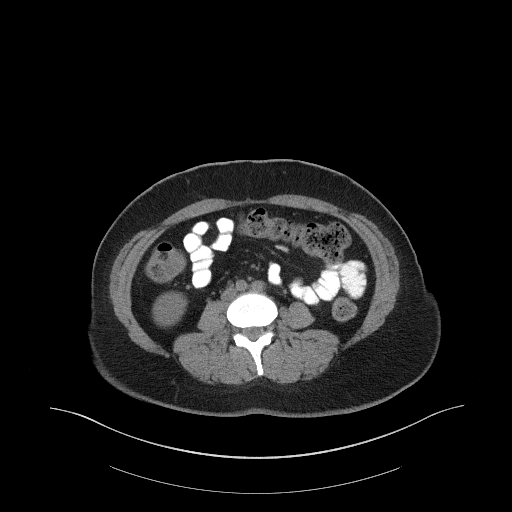
[im 70/108  soft-tissue]
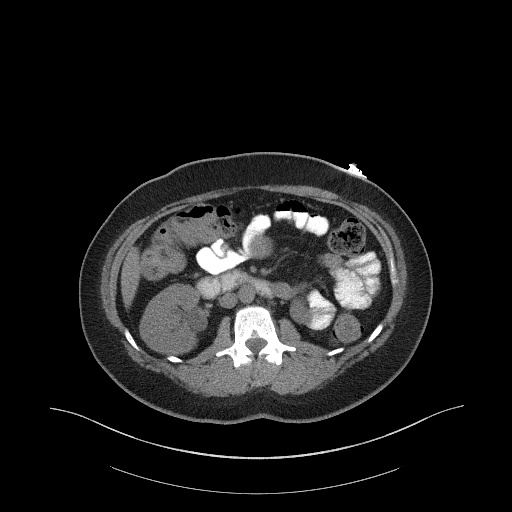
[im 70/108  bone]
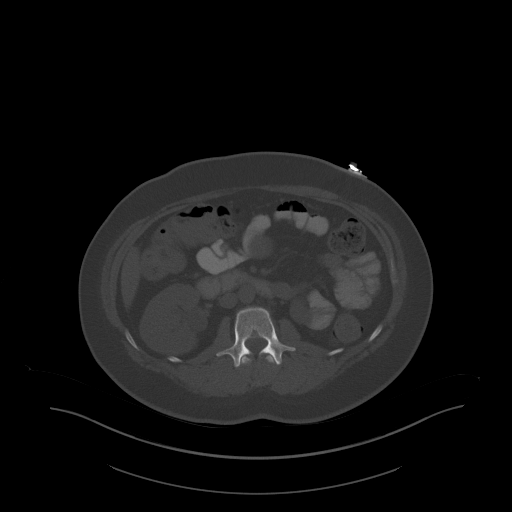
[im 80/108  soft-tissue]
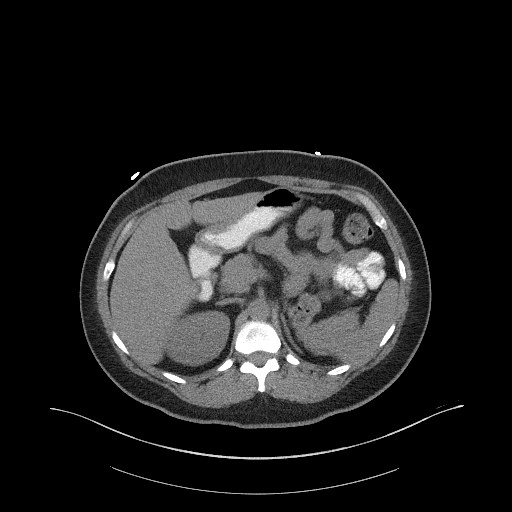
[im 84/108  soft-tissue]
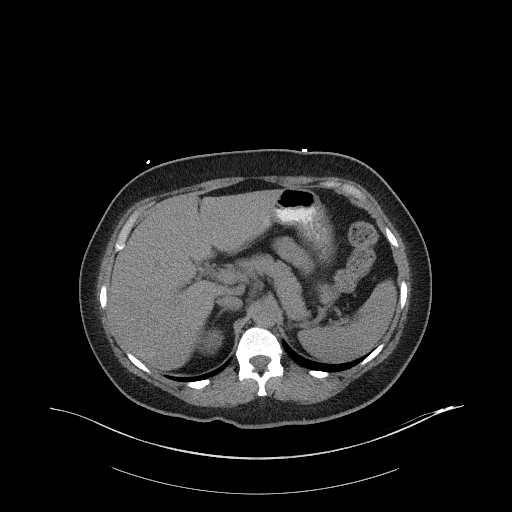
[im 94/108  soft-tissue]
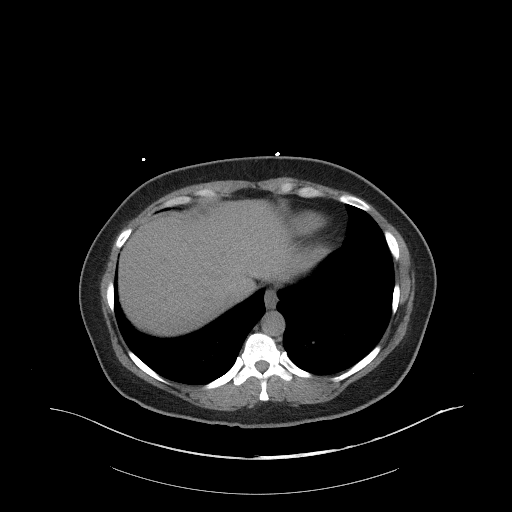
[im 103/108  soft-tissue]
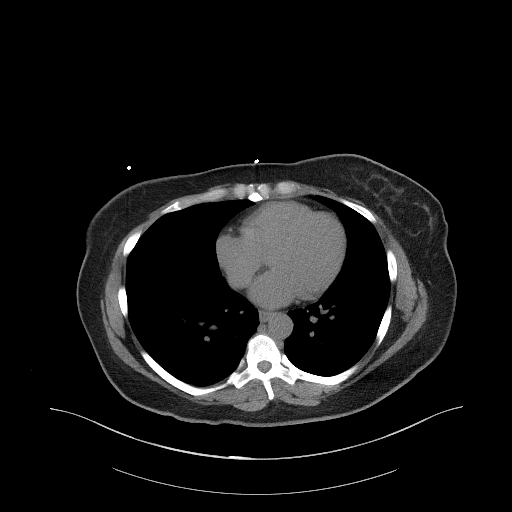

[Series 6: a/p w/o cor · coronal · non-contrast · 1.01mm/px · 3 of 166 slices shown]
[im 74/166  soft-tissue]
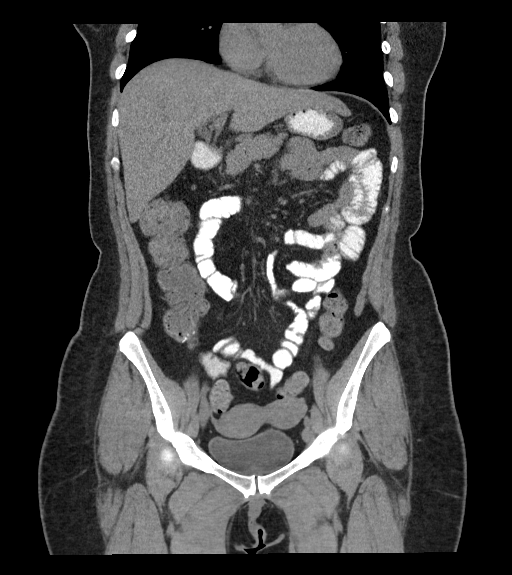
[im 92/166  soft-tissue]
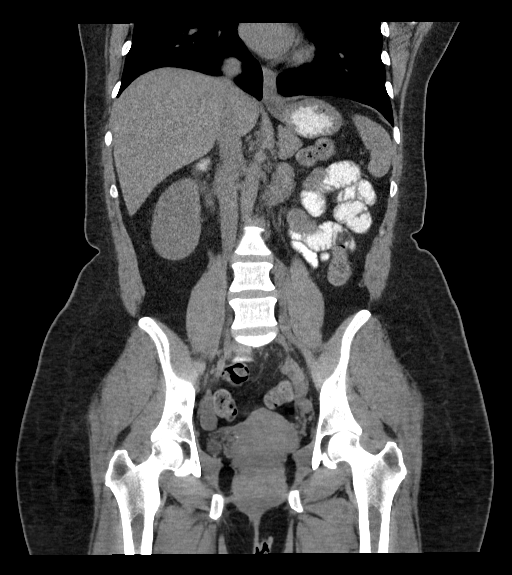
[im 111/166  soft-tissue]
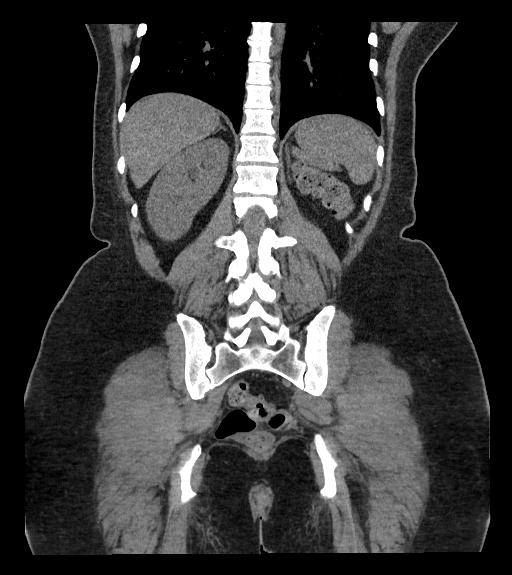

[16 of 46 positions shown; findings below may reference images not displayed]

FINDINGS: Lower chest: The lung bases are clear. The heart size is normal.

Hepatobiliary: The liver is normal. Status post
cholecystectomy.There is no biliary ductal dilation.

Pancreas: Normal contours without ductal dilatation. No
peripancreatic fluid collection.

Spleen: Unremarkable.

Adrenals/Urinary Tract:

--Adrenal glands: Unremarkable.

--Right kidney/ureter: No hydronephrosis or radiopaque kidney
stones.

--Left kidney/ureter: The left kidney is absent.

--Urinary bladder: Unremarkable.

Stomach/Bowel:

--Stomach/Duodenum: No hiatal hernia or other gastric abnormality.
Normal duodenal course and caliber.

--Small bowel: The patient appears to be status post ileocecectomy.
There is a patent anastomosis in the right lower quadrant. There is
no evidence for an obstruction. There may be some mild wall
thickening of the neoterminal ileum (coronal series 6, image 66).

--Colon: May be some mild wall thickening of the ascending colon and
hepatic flexure.

--Appendix: Surgically absent.

Vascular/Lymphatic: Normal course and caliber of the major abdominal
vessels.

--No retroperitoneal lymphadenopathy.

--No mesenteric lymphadenopathy.

--No pelvic or inguinal lymphadenopathy.

Reproductive: There is a v-shaped configuration of the uterus.

Other: No ascites or free air. The abdominal wall is normal.

Musculoskeletal. No acute displaced fractures.
IMPRESSION: 1. Status post ileocecectomy with patent anastomosis in the right
lower quadrant. No evidence for an obstruction.
2. Possible mild wall thickening involving the neoterminal ileum and
ascending colon which can indicate active Crohn's disease.
3. Absent gallbladder.  Absent left kidney.
4. Findings suggestive of a bicornuate uterus versus uterine
didelphys.

## 2023-02-07 ENCOUNTER — Other Ambulatory Visit: Payer: Self-pay

## 2023-02-07 ENCOUNTER — Emergency Department (HOSPITAL_BASED_OUTPATIENT_CLINIC_OR_DEPARTMENT_OTHER)
Admission: EM | Admit: 2023-02-07 | Discharge: 2023-02-07 | Disposition: A | Discharge status: Home / Self Care | Payer: Self-pay | Attending: Emergency Medicine | Admitting: Emergency Medicine

## 2023-02-07 ENCOUNTER — Encounter (HOSPITAL_BASED_OUTPATIENT_CLINIC_OR_DEPARTMENT_OTHER): Payer: Self-pay

## 2023-02-07 ENCOUNTER — Emergency Department (HOSPITAL_BASED_OUTPATIENT_CLINIC_OR_DEPARTMENT_OTHER): Payer: Self-pay

## 2023-02-07 DIAGNOSIS — K529 Noninfective gastroenteritis and colitis, unspecified: Secondary | ICD-10-CM | POA: Insufficient documentation

## 2023-02-07 LAB — URINALYSIS, ROUTINE W REFLEX MICROSCOPIC
Bilirubin Urine: NEGATIVE
Glucose, UA: NEGATIVE mg/dL
Hgb urine dipstick: NEGATIVE
Ketones, ur: NEGATIVE mg/dL
Leukocytes,Ua: NEGATIVE
Nitrite: NEGATIVE
Protein, ur: NEGATIVE mg/dL
Specific Gravity, Urine: 1.025 (ref 1.005–1.030)
pH: 5.5 (ref 5.0–8.0)

## 2023-02-07 LAB — CBC
HCT: 38.4 % (ref 36.0–46.0)
Hemoglobin: 13.1 g/dL (ref 12.0–15.0)
MCH: 30.5 pg (ref 26.0–34.0)
MCHC: 34.1 g/dL (ref 30.0–36.0)
MCV: 89.3 fL (ref 80.0–100.0)
Platelets: 317 10*3/uL (ref 150–400)
RBC: 4.3 MIL/uL (ref 3.87–5.11)
RDW: 12.8 % (ref 11.5–15.5)
WBC: 10.1 10*3/uL (ref 4.0–10.5)
nRBC: 0 % (ref 0.0–0.2)

## 2023-02-07 LAB — COMPREHENSIVE METABOLIC PANEL
ALT: 28 U/L (ref 0–44)
AST: 21 U/L (ref 15–41)
Albumin: 3.8 g/dL (ref 3.5–5.0)
Alkaline Phosphatase: 72 U/L (ref 38–126)
Anion gap: 9 (ref 5–15)
BUN: 8 mg/dL (ref 6–20)
CO2: 23 mmol/L (ref 22–32)
Calcium: 8.7 mg/dL — ABNORMAL LOW (ref 8.9–10.3)
Chloride: 106 mmol/L (ref 98–111)
Creatinine, Ser: 0.68 mg/dL (ref 0.44–1.00)
GFR, Estimated: >60 mL/min (ref >60)
Glucose, Bld: 100 mg/dL — ABNORMAL HIGH (ref 70–99)
Potassium: 3.7 mmol/L (ref 3.5–5.1)
Sodium: 138 mmol/L (ref 135–145)
Total Bilirubin: 0.4 mg/dL (ref 0.0–1.2)
Total Protein: 7.3 g/dL (ref 6.5–8.1)

## 2023-02-07 LAB — LIPASE, BLOOD: Lipase: 34 U/L (ref 11–51)

## 2023-02-07 LAB — PREGNANCY, URINE: Preg Test, Ur: NEGATIVE

## 2023-02-07 MED ORDER — HYDROMORPHONE HCL 1 MG/ML IJ SOLN
1.0000 mg | Freq: Once | INTRAMUSCULAR | Status: AC
  Administered 2023-02-07: 1 mg via INTRAVENOUS
  Filled 2023-02-07: qty 1

## 2023-02-07 MED ORDER — FLUCONAZOLE 150 MG PO TABS: 150.0000 mg | ORAL_TABLET | Freq: Every day | ORAL | 0 refills | Status: AC | PRN

## 2023-02-07 MED ORDER — SODIUM CHLORIDE 0.9 % IV SOLN
12.5000 mg | Freq: Four times a day (QID) | INTRAVENOUS | Status: DC | PRN
  Administered 2023-02-07: 12.5 mg via INTRAVENOUS
  Filled 2023-02-07: qty 0.5

## 2023-02-07 MED ORDER — KETOROLAC TROMETHAMINE 15 MG/ML IJ SOLN
15.0000 mg | Freq: Once | INTRAMUSCULAR | Status: AC
  Administered 2023-02-07: 15 mg via INTRAVENOUS
  Filled 2023-02-07: qty 1

## 2023-02-07 MED ORDER — IOHEXOL 300 MG/ML  SOLN
100.0000 mL | Freq: Once | INTRAMUSCULAR | Status: AC | PRN
  Administered 2023-02-07: 100 mL via INTRAVENOUS

## 2023-02-07 MED ORDER — DEXAMETHASONE SODIUM PHOSPHATE 10 MG/ML IJ SOLN
10.0000 mg | Freq: Once | INTRAMUSCULAR | Status: AC
  Administered 2023-02-07: 10 mg via INTRAVENOUS
  Filled 2023-02-07: qty 1

## 2023-02-07 MED ORDER — CIPROFLOXACIN HCL 500 MG PO TABS: 500.0000 mg | ORAL_TABLET | Freq: Two times a day (BID) | ORAL | 0 refills | Status: AC

## 2023-02-07 MED ORDER — OXYCODONE HCL 10 MG PO TABS: 10.0000 mg | ORAL_TABLET | Freq: Four times a day (QID) | ORAL | 0 refills | Status: AC | PRN

## 2023-02-07 MED ORDER — PROMETHAZINE HCL 25 MG/ML IJ SOLN
INTRAMUSCULAR | Status: AC
  Administered 2023-02-07: 25 mg
  Filled 2023-02-07: qty 1

## 2023-02-07 MED ORDER — METRONIDAZOLE 500 MG PO TABS: 500.0000 mg | ORAL_TABLET | Freq: Three times a day (TID) | ORAL | 0 refills | Status: AC

## 2023-02-07 NOTE — ED Triage Notes (Signed)
Pt has hx of Crohn's and was  on Skyrizzi  but has been off of it since September. Pt is having abdominal pain and n/v/d.

## 2023-02-07 NOTE — ED Notes (Signed)
Patient transported to CT 

## 2023-02-07 NOTE — ED Provider Notes (Signed)
 [facility] Provider Note   CSN: [id number] Arrival date & time: 02/07/23  1309     History  Chief Complaint  Patient presents with   Abdominal Pain    Sarah Vasquez is a 42 y.o. female with a history reportedly of Crohn's disease or ulcerative colitis presented to ED with abdominal pain.  Patient reports that she has been trying to deal with this with persistent abdominal pain for several days or weeks.  But it has become intolerable.  She says he began having vomiting yesterday and noted some blood tinge in her vomit, and also had a watery bowel movement with blood in it.  She feels this is her Crohn's getting worse.  She reports she is off of all her medications in September when she ran out of insurance.  She is applying for Medicaid.  She does not currently have a GI doctor.  She does have a history of bowel resection reported as well as cholecystectomy.  I reviewed external records including GI visit from [facility] 11/17/21, where she has noted to have moderate systemic disease with functional limitations, ASA grade assessment 3 -at which time she was receiving infusions through [facility] Risankizumab last done 05/10/22  HPI     Home Medications Prior to Admission medications   Medication Sig Start Date End Date Taking? Authorizing Provider  ciprofloxacin  (CIPRO ) 500 MG tablet Take 1 tablet (500 mg total) by mouth every 12 (twelve) hours for 7 days. 02/07/23 02/14/23 Yes Esparanza Krider, Donnice PARAS, MD  fluconazole  (DIFLUCAN ) 150 MG tablet Take 1 tablet (150 mg total) by mouth daily as needed for up to 2 doses. 02/07/23  Yes Cottie Donnice PARAS, MD  metroNIDAZOLE  (FLAGYL ) 500 MG tablet Take 1 tablet (500 mg total) by mouth 3 (three) times daily for 7 days. 02/07/23 02/14/23 Yes Panagiotis Oelkers, Donnice PARAS, MD  oxyCODONE  10 MG TABS Take 1 tablet (10 mg total) by mouth every 6 (six) hours as needed for up to 15 doses for severe pain (pain score  7-10). 02/07/23  Yes Kollin Udell, Donnice PARAS, MD  Apremilast  (OTEZLA ) 30 MG TABS Take 1 tablet (30 mg total) by mouth in the morning and at bedtime. 07/18/19   Garrick Charleston, MD  diclofenac  Sodium (VOLTAREN ) 1 % GEL Apply 4 g topically 4 (four) times daily. 07/25/20   Palumbo, April, MD  dicyclomine  (BENTYL ) 20 MG tablet Take 1 tablet (20 mg total) by mouth 2 (two) times daily. 07/18/19   Garrick Charleston, MD  dicyclomine  (BENTYL ) 20 MG tablet Take 1 tablet (20 mg total) by mouth 2 (two) times daily for 20 days. 05/18/20 06/07/20  Cottie Donnice PARAS, MD  diphenhydrAMINE  (SOMINEX) 25 MG tablet Take 1 tablet by mouth daily as needed for itching.     [provider]  diphenoxylate -atropine  (LOMOTIL ) 2.5-0.025 MG tablet Take 1-2 tablets by mouth 4 (four) times daily as needed for diarrhea or loose stools. 12/25/19   Haze Lonni PARAS, MD  FLUoxetine  (PROZAC ) 40 MG capsule Take 1 capsule (40 mg total) by mouth daily. 07/18/19   Garrick Charleston, MD  fluticasone  (FLONASE ) 50 MCG/ACT nasal spray Place 2 sprays into both nostrils daily for 14 days. 10/06/19 10/20/19  Neldon Hamp RAMAN, PA  HYDROcodone -acetaminophen  (NORCO/VICODIN) 5-325 MG tablet Take 1 tablet by mouth every 4 (four) hours as needed for moderate pain. 12/25/19   Haze Lonni PARAS, MD  lidocaine  (LIDODERM ) 5 % Place 1 patch onto the skin daily. Remove & Discard  patch within 12 hours or as directed by MD 07/25/20   Nettie, April, MD  methocarbamol  (ROBAXIN ) 500 MG tablet Take 1 tablet (500 mg total) by mouth 2 (two) times daily. 07/25/20   Palumbo, April, MD  metoCLOPramide  (REGLAN ) 10 MG tablet Take 1 tablet (10 mg total) by mouth every 6 (six) hours. 11/17/20   Neldon Hamp RAMAN, PA  morphine  (MSIR) 30 MG tablet Take 0.5 tablets (15 mg total) by mouth every 4 (four) hours as needed for severe pain. 05/04/21   Mesner, Selinda, MD  ondansetron  (ZOFRAN  ODT) 4 MG disintegrating tablet Take 1 tablet (4 mg total) by mouth every 8 (eight) hours as  needed for nausea or vomiting. 03/11/20   Donah Riis A, PA-C  ondansetron  (ZOFRAN ) 4 MG tablet Take 1 tablet (4 mg total) by mouth every 8 (eight) hours as needed for up to 15 doses for nausea or vomiting. 05/18/20   Cottie Donnice PARAS, MD  oxyCODONE -acetaminophen  (PERCOCET/ROXICET) 5-325 MG tablet Take 1 tablet by mouth every 8 (eight) hours as needed for severe pain. 11/02/20   Joldersma, Logan, PA-C  potassium chloride  (KLOR-CON ) 10 MEQ tablet Take 1 tablet (10 mEq total) by mouth daily for 30 doses. 05/18/20 06/17/20  Cottie Donnice PARAS, MD      Allergies    Morphine  and codeine, Penicillin g, Adalimumab, Azathioprine, and Infliximab    Review of Systems   Review of Systems  Physical Exam Updated Vital Signs BP 129/79   Pulse 65   Temp 98.7 F (37.1 C) (Oral)   Resp 15   SpO2 97%  Physical Exam Constitutional:      General: She is not in acute distress. HENT:     Head: Normocephalic and atraumatic.  Eyes:     Conjunctiva/sclera: Conjunctivae normal.     Pupils: Pupils are equal, round, and reactive to light.  Cardiovascular:     Rate and Rhythm: Normal rate and regular rhythm.  Pulmonary:     Effort: Pulmonary effort is normal. No respiratory distress.  Abdominal:     General: There is no distension.     Tenderness: There is generalized abdominal tenderness.  Skin:    General: Skin is warm and dry.  Neurological:     General: No focal deficit present.     Mental Status: She is alert. Mental status is at baseline.  Psychiatric:        Mood and Affect: Mood normal.        Behavior: Behavior normal.     ED Results / Procedures / Treatments   Labs (all labs ordered are listed, but only abnormal results are displayed) Labs Reviewed  COMPREHENSIVE METABOLIC PANEL - Abnormal; Notable for the following components:      Result Value   Glucose, Bld 100 (*)    Calcium 8.7 (*)    All other components within normal limits  LIPASE, BLOOD  CBC  URINALYSIS, ROUTINE W REFLEX  MICROSCOPIC  PREGNANCY, URINE    EKG None  Radiology CT ABDOMEN PELVIS W CONTRAST Result Date: 02/07/2023 CLINICAL DATA:  Crohn disease exacerbation off therapy with abdominal pain, nausea, vomiting and diarrhea. EXAM: CT ABDOMEN AND PELVIS WITH CONTRAST TECHNIQUE: Multidetector CT imaging of the abdomen and pelvis was performed using the standard protocol following bolus administration of intravenous contrast. RADIATION DOSE REDUCTION: This exam was performed according to the departmental dose-optimization program which includes automated exposure control, adjustment of the mA and/or kV according to patient size and/or use of iterative reconstruction technique.  CONTRAST:  OMNIPAQUE  IOHEXOL  300 MG/ML  SOLN COMPARISON:  11/17/2020 CT abdomen/pelvis FINDINGS: Lower chest: No significant pulmonary nodules or acute consolidative airspace disease. Hepatobiliary: Normal liver size. No liver mass. Cholecystectomy. Bile ducts are within normal post cholecystectomy limits with CBD diameter 6 mm. Pancreas: Normal, with no mass or duct dilation. Spleen: Normal size. No mass. Adrenals/Urinary Tract: Normal adrenals. Normal right kidney with no right hydronephrosis and no contour deforming right renal masses. 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 Normal bladder. Stomach/Bowel: Normal non-distended stomach. Status post ileocecal resection with ileocolic anastomosis in the right abdomen. No dilated or thick-walled small bowel loops. Air-fluid levels noted in the distal small bowel. No focal small bowel caliber transition. No mucosal hyperenhancement in the small bowel. Remnant large bowel is largely collapsed. Borderline mild mucosal hyperenhancement and wall thickening in the ascending colon (series 601/image 95). No pneumatosis or fistula. No additional sites of large bowel wall thickening or mucosal hyperenhancement. No colonic diverticulosis or pericolonic fat stranding. Vascular/Lymphatic:  Atherosclerotic nonaneurysmal abdominal aorta. Patent portal, splenic, hepatic and right renal veins. No pathologically enlarged lymph nodes in the abdomen or pelvis. 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 No right adnexal mass. Left adnexal 3.0 cm simple cyst (series 301/image 70). Other: No pneumoperitoneum, ascites or focal fluid collection. Diastasis of the periumbilical ventral abdominal wall without discrete hernia. Musculoskeletal: No aggressive appearing focal osseous lesions. Minimal thoracolumbar spondylosis. IMPRESSION: 1. Status post ileocecal resection with ileocolic anastomosis in the right abdomen. Evidence of mild active inflammatory bowel disease in the right abdomen with air-fluid levels in distal small bowel and borderline mild mucosal hyperenhancement and wall thickening in the ascending colon. No evidence of bowel obstruction, fistula or abscess. 2. Left adnexal 3.0 cm simple cyst. No follow-up imaging recommended. 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 4.  Aortic Atherosclerosis (ICD10-I70.0). Electronically Signed   By: Selinda DELENA Blue M.D.   On: 02/07/2023 22:15    Procedures Procedures    Medications Ordered in ED Medications  promethazine  (PHENERGAN ) 12.5 mg in sodium chloride  0.9 % 50 mL IVPB (0 mg Intravenous Stopped 02/07/23 2040)  HYDROmorphone  (DILAUDID ) injection 1 mg (1 mg Intravenous Given 02/07/23 1933)  ketorolac  (TORADOL ) 15 MG/ML injection 15 mg (15 mg Intravenous Given 02/07/23 1932)  iohexol  (OMNIPAQUE ) 300 MG/ML solution 100 mL (100 mLs Intravenous Contrast Given 02/07/23 1951)  promethazine  (PHENERGAN ) 25 MG/ML injection (25 mg  Given 02/07/23 2014)  HYDROmorphone  (DILAUDID ) injection 1 mg (1 mg Intravenous Given 02/07/23 2321)  dexamethasone  (DECADRON ) injection 10 mg (10 mg Intravenous Given 02/07/23 2320)    ED Course/ Medical Decision Making/ A&P Clinical  Course as of 02/07/23 2357  Tue Feb 07, 2023  2314 Pain has improved.  On reassessment the patient is feeling okay to go home but is requesting additional pain medicine prior to discharge as she is unable to get to the pharmacy this evening.  I think this is reasonable.  She has a safe ride to take her home and is not driving tonight.  She was willing to take a single dose of IV steroids in the ED but does not want steroids prescribed at home because she has adverse reactions to them including skin disorder and mood swings.  Unfortunately suspect this is likely related to colitis due to her autoimmune condition or Crohn's, steroids would be the most beneficial, but also that she needs to see GI which she is working  on.  I thought it was reasonable to treat for potential infectious colitis as well as ciprofloxacin  and Flagyl , but I advised her that this may not resolve her symptoms. [MT]  2315 Ultimately as she is currently uninsured in between providers and is needing better pain control, I did agree to provide her a short-term prescription for opioid pain medications.  We had a discussion about the side effects of these medications including constipation, which could cause ileus and worsening abdominal pain.  She is adamant that she has managed his medications in the past and taken opiates and she is not prone to constipation, as she is able to manage these side effects.   [MT]    Clinical Course User Index [MT] Adilee Lemme, Donnice PARAS, MD                                 Medical Decision Making Amount and/or Complexity of Data Reviewed Labs: ordered. Radiology: ordered.  Risk Prescription drug management.   This patient presents to the ED with concern for abdominal pain, nausea, diarrhea. This involves an extensive number of treatment options, and is a complaint that carries with it a high risk of complications and morbidity.  The differential diagnosis includes colitis versus viral gastroenteritis  versus other intra-abdominal inflammatory infectious process  Co-morbidities that complicate the patient evaluation: History of Crohn's disease at high risk exacerbation  External records from outside source obtained and reviewed including outpatient GI records  I ordered and personally interpreted labs.  The pertinent results include: No emergent findings.  No leukocytosis  I ordered imaging studies including CT abdomen pelvis I independently visualized and interpreted imaging which showed colonic inflammation I agree with the radiologist interpretation  The patient was maintained on a cardiac monitor.  I personally viewed and interpreted the cardiac monitored which showed an underlying rhythm of: Sinus rhythm   I ordered medication including IV pain medicine, IV steroids, IV nausea medicine  I have reviewed the patients home medicines and have made adjustments as needed  Test Considered: No emergent indication for pelvic ultrasound, doubt acute ovarian torsion  After the interventions noted above, I reevaluated the patient and found that they have: improved   Dispostion:  After consideration of the diagnostic results and the patients response to treatment, I feel that the patent would benefit from close outpatient follow-up..         Final Clinical Impression(s) / ED Diagnoses Final diagnoses:  Colitis    Rx / DC Orders ED Discharge Orders          Ordered    oxyCODONE  10 MG TABS  Every 6 hours PRN        02/07/23 2313    fluconazole  (DIFLUCAN ) 150 MG tablet  Daily PRN        02/07/23 2313    ciprofloxacin  (CIPRO ) 500 MG tablet  Every 12 hours        02/07/23 2313    metroNIDAZOLE  (FLAGYL ) 500 MG tablet  3 times daily        02/07/23 2313              Cottie Donnice PARAS, MD 02/07/23 2357

## 2023-02-07 NOTE — Discharge Instructions (Signed)
 Your CT scan showed that you have colitis or inflammation around your colon and bowels.  This may be related to an infection or an inflammatory disease.  This could include Crohn's disease.  We did start you on treatment with antibiotics which you will take for 1 week.  If your symptoms are not improving, this may be related to your Crohn's disease.  You will need follow-up with a GI doctor.  If your pain is getting worse, or if you have worsening bloody stools, inability to keep down fluids, dehydration, or other concerning medical concerns, please return to the ER.  I did prescribe you a short course of opioid pain medicines with oxycodone .  Please be aware that these can cause constipation.  These can slow your bowel movements.  Consider using a stool softener or laxative, or and stop his medications if you do develop constipation.  You were given narcotic opiates in the ER today.  Do not drive a car this evening.  Do not drive a car or perform dangerous activities after taking opioid narcotics.

## 2023-11-03 ENCOUNTER — Other Ambulatory Visit: Payer: Self-pay | Admitting: Medical Genetics

## 2023-11-09 ENCOUNTER — Other Ambulatory Visit
Admission: RE | Admit: 2023-11-09 | Discharge: 2023-11-09 | Disposition: A | Discharge status: Home / Self Care | Payer: PRIVATE HEALTH INSURANCE | Source: Ambulatory Visit | Attending: Medical Genetics | Admitting: Medical Genetics

## 2023-11-17 LAB — GENECONNECT MOLECULAR SCREEN: Genetic Analysis Overall Interpretation: NEGATIVE
